# Patient Record
Sex: Female | Born: 1970
Health system: Southern US, Community
[De-identification: ages and names within clinical notes are randomized; demographics above are authoritative.]

## PROBLEM LIST (undated history)

## (undated) DIAGNOSIS — F419 Anxiety disorder, unspecified: Secondary | ICD-10-CM

## (undated) DIAGNOSIS — Z5189 Encounter for other specified aftercare: Secondary | ICD-10-CM

## (undated) DIAGNOSIS — D649 Anemia, unspecified: Secondary | ICD-10-CM

## (undated) DIAGNOSIS — Z789 Other specified health status: Secondary | ICD-10-CM

## (undated) HISTORY — PX: ABDOMINAL HYSTERECTOMY: SHX81

## (undated) HISTORY — PX: DIAGNOSTIC LAPAROSCOPY: SUR761

## (undated) HISTORY — DX: Anxiety disorder, unspecified: F41.9

## (undated) HISTORY — DX: Anemia, unspecified: D64.9

---

## 2000-02-22 ENCOUNTER — Other Ambulatory Visit: Admission: RE | Admit: 2000-02-22 | Discharge: 2000-02-22 | Payer: Self-pay | Admitting: Obstetrics and Gynecology

## 2001-02-13 ENCOUNTER — Inpatient Hospital Stay (HOSPITAL_COMMUNITY): Admission: AD | Admit: 2001-02-13 | Discharge: 2001-02-16 | Payer: Self-pay | Admitting: Obstetrics and Gynecology

## 2001-03-13 ENCOUNTER — Other Ambulatory Visit: Admission: RE | Admit: 2001-03-13 | Discharge: 2001-03-13 | Payer: Self-pay | Admitting: Obstetrics and Gynecology

## 2002-04-15 ENCOUNTER — Other Ambulatory Visit: Admission: RE | Admit: 2002-04-15 | Discharge: 2002-04-15 | Payer: Self-pay | Admitting: Obstetrics and Gynecology

## 2007-02-18 ENCOUNTER — Emergency Department (HOSPITAL_COMMUNITY): Admission: EM | Admit: 2007-02-18 | Discharge: 2007-02-18 | Payer: Self-pay | Admitting: Emergency Medicine

## 2007-05-11 ENCOUNTER — Encounter: Admission: RE | Admit: 2007-05-11 | Discharge: 2007-05-11 | Payer: Self-pay | Admitting: Internal Medicine

## 2008-10-07 ENCOUNTER — Encounter: Admission: RE | Admit: 2008-10-07 | Discharge: 2008-10-07 | Payer: Self-pay | Admitting: Obstetrics and Gynecology

## 2008-12-09 ENCOUNTER — Inpatient Hospital Stay (HOSPITAL_COMMUNITY): Admission: AD | Admit: 2008-12-09 | Discharge: 2008-12-09 | Payer: Self-pay | Admitting: Obstetrics & Gynecology

## 2008-12-12 ENCOUNTER — Ambulatory Visit (HOSPITAL_COMMUNITY): Admission: RE | Admit: 2008-12-12 | Discharge: 2008-12-12 | Payer: Self-pay | Admitting: Obstetrics and Gynecology

## 2009-01-27 ENCOUNTER — Inpatient Hospital Stay (HOSPITAL_COMMUNITY): Admission: AD | Admit: 2009-01-27 | Discharge: 2009-01-27 | Payer: Self-pay | Admitting: Obstetrics & Gynecology

## 2009-08-06 ENCOUNTER — Encounter (INDEPENDENT_AMBULATORY_CARE_PROVIDER_SITE_OTHER): Payer: Self-pay | Admitting: Obstetrics and Gynecology

## 2009-08-06 ENCOUNTER — Inpatient Hospital Stay (HOSPITAL_COMMUNITY): Admission: AD | Admit: 2009-08-06 | Discharge: 2009-08-09 | Payer: Self-pay | Admitting: Obstetrics and Gynecology

## 2010-06-22 ENCOUNTER — Other Ambulatory Visit: Payer: Self-pay | Admitting: Obstetrics and Gynecology

## 2010-06-22 DIAGNOSIS — Z1231 Encounter for screening mammogram for malignant neoplasm of breast: Secondary | ICD-10-CM

## 2010-06-30 ENCOUNTER — Ambulatory Visit
Admission: RE | Admit: 2010-06-30 | Discharge: 2010-06-30 | Disposition: A | Discharge status: Home / Self Care | Payer: 59 | Source: Ambulatory Visit | Attending: Obstetrics and Gynecology | Admitting: Obstetrics and Gynecology

## 2010-06-30 DIAGNOSIS — Z1231 Encounter for screening mammogram for malignant neoplasm of breast: Secondary | ICD-10-CM

## 2010-07-06 LAB — TYPE AND SCREEN: Antibody Screen: NEGATIVE

## 2010-07-06 LAB — CBC
HCT: 29.5 % — ABNORMAL LOW (ref 36.0–46.0)
HCT: 39.3 % (ref 36.0–46.0)
Hemoglobin: 10.4 g/dL — ABNORMAL LOW (ref 12.0–15.0)
MCHC: 35.3 g/dL (ref 30.0–36.0)
RDW: 15.5 % (ref 11.5–15.5)
WBC: 13.6 10*3/uL — ABNORMAL HIGH (ref 4.0–10.5)

## 2010-07-06 LAB — PREPARE RBC (CROSSMATCH)

## 2010-07-24 LAB — ABO/RH: ABO/RH(D): O POS

## 2010-07-24 LAB — CBC
HCT: 40.7 % (ref 36.0–46.0)
Hemoglobin: 12.9 g/dL (ref 12.0–15.0)
MCHC: 33.9 g/dL (ref 30.0–36.0)
MCHC: 34.4 g/dL (ref 30.0–36.0)
MCV: 82.8 fL (ref 78.0–100.0)
RBC: 4.54 MIL/uL (ref 3.87–5.11)

## 2010-07-24 LAB — TYPE AND SCREEN: Antibody Screen: NEGATIVE

## 2010-08-31 NOTE — Op Note (Signed)
Megan Burch, Megan Burch                 ACCOUNT NO.:  [id number]   MEDICAL RECORD NO.:  [id number]          PATIENT TYPE:  AMB   LOCATION:  SDC                           FACILITY:  [facility]   PHYSICIAN:  IT trainer, M.D.DATE OF BIRTH:  1970-11-12   DATE OF PROCEDURE:  12/12/2008  DATE OF DISCHARGE:  12/12/2008                               OPERATIVE REPORT   PREOPERATIVE DIAGNOSES:  Left lower quadrant pain, left adnexal mass,  question left ectopic pregnancy.   PROCEDURE:  Open diagnostic laparoscopy.   POSTOPERATIVE DIAGNOSIS:  Left lower quadrant pain.   ANESTHESIA:  General.   SURGEON:  Maxie Better, MD   ASSISTANT:  Sherry A. Rosalio Macadamia, MD   INDICATIONS:  This is a 40 year old gravida 3, para 2 female with a last  menstrual period at the end of July who with a history of cesarean  section x2 presented with 4 days ago with a complaint of acute left  lower quadrant pain and a positive pregnancy test.  Ultrasound done in  the office has revealed a perivascular 2.6 cm left adnexal mass.  The  location was unclear.  The patient was sent to [facility], where a  quantitative HCG was then performed which was 278.  The patient has  returned 2 days later for a repeat quant which was 505 and a repeat  ultrasound which showed increase in the size of the vascularity to 3.6  cm mass and some increased fluid in the posterior cul-de-sac.  After  lengthy discussion, the patient opted for a surgery to further delineate  this mass.  Surgical risk was reviewed, including possible loss of the  pregnancy and possible loss of the involved tube if it is an ectopic,  possibility of the heterotopic pregnancy was also discussed.  The  patient was transferred to the operating room.   PROCEDURE:  Under adequate general anesthesia, the patient was placed in  the dorsal lithotomy position.  A indwelling Foley catheter was placed  after sterilely prepped and draped in the patient.  I opted not  to place  any instruments in the vagina due to the possibility that this maybe an  early intrauterine pregnancy and attention was then turned to the  abdomen.  A 0.25% Marcaine was injected infraumbilically.  An  infraumbilical incision was then made and after a long a good 1 inch  depth around the fascia was located, opened, and the parietal peritoneum  was then opened sharply.  The pursestring of 0 Vicryl suture was then  placed around this fascia.  A Hasson cannula was then introduced and the  lighted video laparoscope was then placed through that port.  The  initial entry into the pelvis was atraumatic.  There was no evidence of  blood in the pelvis.  Decision was then made to after attempting to use  a long probe through the laparoscope port.  Decision was then made to  place a small port on the left lower quadrant.  A small incision was  made and a 5-mm trocar was introduced under direct visualization with  some bleeding  noted at the site on the anterior abdominal wall.  Using a  probe, the uterus was gently lifted.  Both tubes and ovaries were noted.  There is a clear yellow fluid in the posterior cul-de-sac.  The right  tube and ovary was normal.  The left tube was otherwise normal.  The  ovary on the left showed that it has probably a corpus luteal cyst.  There was a large prominent blood vessels below that almost similar to  what would expect for a pelvic congestion, but no actually defined  abnormality on either side.  The fluid in the cul-de-sac was aspirated.  There was an omental adhesion focally anterior abdominal near the  umbilical incision, but that was left alone.  After assuring that there  was nothing abnormal other than the vessels that were present, decision  was then made to complete the procedure.  The left lower quadrant site  was removed.  Kleppinger was then used to cauterize the base of that  left lower quadrant site.  Good hemostasis noted.  The trocar was   removed.  The abdomen was deflated. A finger was placed in the umbilical  site and the fascial stitch was pulled up around it  in order to prevent  bringing up of any bowels in the closure.  The fascia was closed  subcuticular 4-0 Vicryl sutures in place for both umbilical and the left  lower quadrant incision and Dermabond placed.  Specimen was none.  Estimated blood loss was minimal.  Complication was none.  The patient  tolerated the procedure well and was transferred to the recovery room in  stable condition to have a repeat quantitative HCG will be done in the  office on August 30.      Maxie Better, M.D.  Electronically Signed     [place]/MEDQ  D:  12/12/2008  T:  12/13/2008  Job:  [id number]

## 2010-09-03 NOTE — Discharge Summary (Signed)
[facility]  Patient:    Megan Burch, Megan Burch Visit Number: [id number] MRN: [id number]          Service Type: OBS Location: 910A 9109 [id number] Attending Physician:  Maxie Better Dictated by:   Sheria Lang. Cherly Hensen, M.D. Admit Date:  02/13/2001 Discharge Date: 02/16/2001                             Discharge Summary  ADMISSION DIAGNOSIS:  Term gestation, previous cesarean section.  DISCHARGE DIAGNOSES: 1. Term gestation, delivered. 2. Previous cesarean section. 3. Postoperative anemia.  PROCEDURE:  Repeat cesarean section.  HISTORY OF PRESENT ILLNESS:  The patient is a 40 year old, gravida 2, para 1-0-0-1, with previous cesarean section admitted for repeat cesarean section.  HOSPITAL COURSE:  The patient was admitted to [facility].  She underwent a repeat low transverse cesarean section under spinal anesthesia.  This resulted in the delivery of a live female, Apgars of 8 and 9.  Subsequent weight of the baby was 6 pounds 2 ounces.  She had an uncomplicated postoperative course.  Blood type is O+.  Rubella is equivocal.  Therefore, the patient received a repeat rubella vaccination.  Her CBC on postoperative day #1, showed a hemoglobin of 10.8, hematocrit of 30.4, white count of 11, platelet count of 206,000.  With the patient tolerating a regular diet, having had flatus, and remaining afebrile, she was discharged on postoperative day #3.  DISPOSITION:  Home.  CONDITION ON DISCHARGE:  Stable.  DISCHARGE MEDICATIONS: 1. Prenatal vitamins one p.o. q.d. 2. Tylox one or two tablets q.3-4h. p.r.n. pain. 3. Motrin 600 mg p.o. q.6h. p.r.n. pain.  FOLLOWUP:  [facility] in 4 to 6 weeks.  DISCHARGE INSTRUCTIONS: 1. Call for temperature greater than or equal to 100.4. 2. Nothing per vagina for 4 to 6 weeks. 3. No heavy lifting or driving for 2 weeks. 4. Call if increased incisional pain, redness, or drainage from the incision    site,  severe abdominal pain, nausea or vomiting, soaking a regular pad    every hour or more frequently.Dictated by:   Sheria Lang. Cherly Hensen, M.D.  Attending Physician:  Maxie Better DD:  03/13/01 TD:  03/13/01 Job: [id number]

## 2010-09-03 NOTE — Op Note (Signed)
[facility]  Patient:    Megan Burch, Megan Burch Visit Number: [id number] MRN: [id number]          Service Type: OBS Location: MATC Attending Physician:  Maxie Better Dictated by:   Sheria Lang. Cherly Hensen, M.D. Proc. Date: 02/13/01                             Operative Report  PREOPERATIVE DIAGNOSES:       1. Term gestation.                               2. Previous cesarean section.  POSTOPERATIVE DIAGNOSES:      1. Term gestation.                               2. Previous cesarean section.  PROCEDURE:                    Repeat cesarean section, Kerr hysterotomy.  SURGEON:                      Sheronette A. Cherly Hensen, M.D.  ASSISTANT:                    Pershing Cox, M.D.  ANESTHESIA:                   Spinal.  INDICATIONS:                  This is a 40 year old gravida 2, para 1-0-0-1 female with a previous cesarean section who is now at term and who desires a repeat cesarean section.  The risks and benefits of the procedure have been explained to the patient and her husband.  Consent was signed.  The patient was transferred to the operating room.  DESCRIPTION OF PROCEDURE:     Under adequate spinal anesthesia, the patient was placed in the supine position with a left lateral tilt.  She was sterilely prepped and draped in the usual fashion.  An indwelling Foley catheter was sterilely placed.  A total of 10 cc of 0.25% Marcaine was injected along the previous Pfannenstiel skin incision.  A Pfannenstiel incision was made through the previous scar and carried down to the rectus fascia using a scalpel as well as Bovie cautery.  The rectus fascia was incised in the midline and extended bilaterally.  The rectus fascia was then bluntly and with the Mayo scissors dissected off the rectus muscle in a superior and inferior fashion. In dissecting the upper portion of the rectus fascia from the rectus muscle, the parietal peritoneum was subsequently  entered.  The parietal peritoneum was then extended inferiorly as well as superiorly.  The vesicouterine peritoneum was developed.  The bladder was slightly adherent to the lower uterine segment.  However, with blunt dissection, the bladder was displaced from the lower uterine segment and retracted with a Doyen retractor.  A curvilinear transverse lower uterine incision was then made and extended bluntly. Amniotic fluid bag was noted, which was then ruptured with clear fluid. Subsequent delivery of a live female infant from the left occiput transverse position was accomplished.  The baby was delivered and bulb suctioned on the abdomen.  The cord was then clamped and cut.  The baby was transferred to the awaiting pediatricians,  who subsequently assigned Apgars of 8 and 9 at one and five minutes.  The weight of the baby was 6 lb 2 oz.  Cord bloods were obtained.  The placenta was spontaneous and intact.  The uterine cavity was cleaned of debris.  No intracavitary abnormalities were noted.  The uterine incision was closed in two layers.  The first layer was a running lock stitch of 0 Monocryl.  The second layer was and imbricating using 0 Monocryl suture. Small bleeding along the peritoneal edges was cauterized.  The abdomen was copiously irrigated and suctioned of fluid and debris.  Normal tubes and ovaries were noted bilaterally.  With good hemostasis noted along the incision line, the parietal and the vesicouterine peritoneum were not closed.  The surface of the rectus fascia was inspected.  The rectus fascia was closed with 0 Vicryl x 2.  The subcutaneous area was irrigated.  Small bleeders were cauterized.  The skin was approximated using Ethicon staples.  Specimen labeled placenta was not sent to pathology.  Estimated blood loss was 650 cc. Urine output was 50 cc of clear yellow urine.  Intraoperative fluid was 3100 cc of Ringers lactate.  Sponge and instrument counts x 2 were  correct. There were no complications.  The patient tolerated the procedure well and was transferred to the recovery room in stable condition. Dictated by:   Sheria Lang. Cherly Hensen, M.D. Attending Physician:  Maxie Better DD:  02/13/01 TD:  02/14/01 Job: [id number]

## 2011-06-16 ENCOUNTER — Other Ambulatory Visit: Payer: Self-pay | Admitting: Obstetrics and Gynecology

## 2011-06-16 DIAGNOSIS — Z1231 Encounter for screening mammogram for malignant neoplasm of breast: Secondary | ICD-10-CM

## 2011-07-05 ENCOUNTER — Ambulatory Visit
Admission: RE | Admit: 2011-07-05 | Discharge: 2011-07-05 | Disposition: A | Discharge status: Home / Self Care | Payer: 59 | Source: Ambulatory Visit | Attending: Obstetrics and Gynecology | Admitting: Obstetrics and Gynecology

## 2011-07-05 DIAGNOSIS — Z1231 Encounter for screening mammogram for malignant neoplasm of breast: Secondary | ICD-10-CM

## 2011-07-13 ENCOUNTER — Other Ambulatory Visit: Payer: Self-pay | Admitting: Obstetrics and Gynecology

## 2011-07-13 ENCOUNTER — Observation Stay (HOSPITAL_COMMUNITY)
Admission: AD | Admit: 2011-07-13 | Discharge: 2011-07-13 | Disposition: A | Discharge status: Home / Self Care | Payer: 59 | Source: Ambulatory Visit | Attending: Obstetrics and Gynecology | Admitting: Obstetrics and Gynecology

## 2011-07-13 ENCOUNTER — Encounter (HOSPITAL_COMMUNITY): Payer: Self-pay | Admitting: *Deleted

## 2011-07-13 DIAGNOSIS — N920 Excessive and frequent menstruation with regular cycle: Secondary | ICD-10-CM | POA: Insufficient documentation

## 2011-07-13 DIAGNOSIS — Z5189 Encounter for other specified aftercare: Secondary | ICD-10-CM

## 2011-07-13 DIAGNOSIS — D500 Iron deficiency anemia secondary to blood loss (chronic): Principal | ICD-10-CM | POA: Insufficient documentation

## 2011-07-13 DIAGNOSIS — D649 Anemia, unspecified: Secondary | ICD-10-CM

## 2011-07-13 DIAGNOSIS — IMO0001 Reserved for inherently not codable concepts without codable children: Secondary | ICD-10-CM

## 2011-07-13 HISTORY — DX: Reserved for inherently not codable concepts without codable children: IMO0001

## 2011-07-13 HISTORY — DX: Encounter for other specified aftercare: Z51.89

## 2011-07-13 HISTORY — DX: Other specified health status: Z78.9

## 2011-07-13 LAB — PREPARE RBC (CROSSMATCH)

## 2011-07-13 MED ORDER — MEDROXYPROGESTERONE ACETATE 400 MG/ML IM SUSP
300.0000 mg | Freq: Once | INTRAMUSCULAR | Status: AC
  Administered 2011-07-13: 300 mg via INTRAMUSCULAR
  Filled 2011-07-13: qty 0.75

## 2011-07-13 MED ORDER — ACETAMINOPHEN 325 MG PO TABS
650.0000 mg | ORAL_TABLET | Freq: Once | ORAL | Status: AC
  Administered 2011-07-13: 650 mg via ORAL
  Filled 2011-07-13: qty 2

## 2011-07-13 MED ORDER — DIPHENHYDRAMINE HCL 25 MG PO CAPS
25.0000 mg | ORAL_CAPSULE | Freq: Once | ORAL | Status: AC
  Administered 2011-07-13: 25 mg via ORAL
  Filled 2011-07-13: qty 1

## 2011-07-13 MED ORDER — IBUPROFEN 800 MG PO TABS
800.0000 mg | ORAL_TABLET | Freq: Once | ORAL | Status: AC
  Administered 2011-07-13: 800 mg via ORAL
  Filled 2011-07-13: qty 1

## 2011-07-13 NOTE — Progress Notes (Signed)
Pt to be discharged after receiving  blood. Pt complaining shortness of breath. Encouraged pt to ambulate when blood finished transfusing.Checked pt vitals signs posttransfusion. Notified Dr. Cherly Hensen of pt latest vital and complaints of shortness of breath and the feeling that there is some swelling in her feet. No swelling noted in LLE extremities by Primary Nurse.  Dr. Cherly Hensen said pt ok to go home.

## 2011-07-13 NOTE — Progress Notes (Signed)
Discharge instructions given to and reviewed with patient.  Pt verbalized understanding of discharge instructions.2nd copy of discharge instructions signed and place in patient shadow chart. Pt escorted out by Boykin Reaper, Nurse tech, via wheelchair.

## 2011-07-13 NOTE — Discharge Instructions (Signed)
Call if soaking a pad  q hours or more freq

## 2011-07-13 NOTE — H&P (Signed)
Megan Burch is an 41 y.o. female. P3 MBF sent for blood transfusion 2nd to symptomatic anemia due to prolonged heavy cycles > 1 yr. Pt has currently been bleeding for 3 wk. (+) clots. C/o fatigue. sono done in office showed thickened endometrium. hgb 6.6 hct 17  Pertinent Gynecological History: Menses: flow is excessive with use of multiple pads or tampons on heaviest days Bleeding: heavy Contraception: tubal ligation DES exposure: denies Blood transfusions: current Sexually transmitted diseases: no past history Previous GYN Procedures: c/s  Last mammogram: normal Date:2010 Last pap: normal Date: 2010 OB History: G3, P3   Menstrual History: Menarche age:  No LMP recorded. 3 wk ago    Past Medical History  Diagnosis Date  . No pertinent past medical history   hgb C trait  Past Surgical History  Procedure Date  . Cesarean section   TL  No family history on file.noncontributory  Social History:  reports that she has never smoked. She has never used smokeless tobacco. She reports that she does not drink alcohol or use illicit drugs.  Allergies: No Known Allergies  Prescriptions prior to admission  Medication Sig Dispense Refill  . Cyanocobalamin (VITAMIN B12 PO) Take 1 tablet by mouth every morning.      . fish oil-omega-3 fatty acids 1000 MG capsule Take 1 g by mouth every morning.      Marland Kitchen ibuprofen (ADVIL,MOTRIN) 200 MG tablet Take 600 mg by mouth every 6 (six) hours as needed.        ROS: normal menses, no abnormal bleeding, pelvic pain or discharge, no breast pain or new or enlarging lumps on self exam, no discharge or pelvic pain  Blood pressure 131/74, pulse 104, temperature 99 F (37.2 C), temperature source Oral, resp. rate 18, SpO2 100.00%.  PHYSICAL EXAM: General: alert, cooperative and no distress Resp: clear to auscultation bilaterally Cardio: regular rate and rhythm, S1, S2 normal, no murmur, click, rub or gallop GI: soft, non-tender; bowel sounds normal;  no masses,  no organomegaly Extremities: extremities normal, atraumatic, no cyanosis or edema Vaginal Bleeding: moderate  Results for orders placed during the hospital encounter of 07/13/11 (from the past 24 hour(s))  PREPARE RBC (CROSSMATCH)     Status: Normal   Collection Time   07/13/11 11:05 AM      Component Value Range   Order Confirmation ORDER PROCESSED BY BLOOD BANK    TYPE AND SCREEN     Status: Normal (Preliminary result)   Collection Time   07/13/11 11:05 AM      Component Value Range   ABO/RH(D) O POS     Antibody Screen NEG     Sample Expiration 07/16/2011     Unit Number [id number]     Blood Component Type RBC CPDA1, LR     Unit division 00     Status of Unit ISSUED     Transfusion Status OK TO TRANSFUSE     Crossmatch Result Compatible     Unit Number [id number]     Blood Component Type RED CELLS,LR     Unit division 00     Status of Unit ISSUED     Transfusion Status OK TO TRANSFUSE     Crossmatch Result Compatible     Unit Number [id number]     Blood Component Type RED CELLS,LR     Unit division 00     Status of Unit ISSUED     Transfusion Status OK TO TRANSFUSE     Crossmatch Result  Compatible      No results found.  Assessment/Plan: Menometrorrhagia w/ resultant severe iron deficiency anemia superimposed on hgb c trait P) 3 units PRBC.  Oral iron . DMPA 300mg  IM x 1. Disc options: endom ablation, declines OCP, hysterectomy( desired)  Megan Burch A 07/13/2011, 6:21 PM

## 2011-07-14 LAB — TYPE AND SCREEN
Antibody Screen: NEGATIVE
Unit division: 0

## 2011-07-26 ENCOUNTER — Other Ambulatory Visit: Payer: Self-pay | Admitting: Obstetrics and Gynecology

## 2011-07-29 ENCOUNTER — Encounter (HOSPITAL_COMMUNITY): Payer: Self-pay | Admitting: Pharmacist

## 2011-08-10 ENCOUNTER — Encounter (HOSPITAL_COMMUNITY): Payer: Self-pay

## 2011-08-10 ENCOUNTER — Encounter (HOSPITAL_COMMUNITY)
Admission: RE | Admit: 2011-08-10 | Discharge: 2011-08-10 | Disposition: A | Discharge status: Home / Self Care | Payer: 59 | Source: Ambulatory Visit | Attending: Obstetrics and Gynecology | Admitting: Obstetrics and Gynecology

## 2011-08-10 HISTORY — DX: Encounter for other specified aftercare: Z51.89

## 2011-08-10 LAB — CBC
HCT: 36.1 % (ref 36.0–46.0)
Hemoglobin: 12.3 g/dL (ref 12.0–15.0)
MCV: 76 fL — ABNORMAL LOW (ref 78.0–100.0)
Platelets: 412 10*3/uL — ABNORMAL HIGH (ref 150–400)
RBC: 4.75 MIL/uL (ref 3.87–5.11)
WBC: 9.3 10*3/uL (ref 4.0–10.5)

## 2011-08-10 LAB — BASIC METABOLIC PANEL
CO2: 26 mEq/L (ref 19–32)
Chloride: 103 mEq/L (ref 96–112)
Glucose, Bld: 82 mg/dL (ref 70–99)
Sodium: 138 mEq/L (ref 135–145)

## 2011-08-10 NOTE — Patient Instructions (Addendum)
20 Megan Burch  08/10/2011   Your procedure is scheduled on:  4/29  Enter through the Main Entrance of [facility] at 1130 AM.  Pick up the phone at the desk and dial [phone].   Call this number if you have problems the morning of surgery: [phone]   Remember:   Do not eat food:After Midnight.  Do not drink clear liquids: after 9AM  Take these medicines the morning of surgery with A SIP OF WATER: NA   Do not wear jewelry, make-up or nail polish.  Do not wear lotions, powders, or perfumes. You may wear deodorant.  Do not shave 48 hours prior to surgery.  Do not bring valuables to the hospital.  Contacts, dentures or bridgework may not be worn into surgery.  Leave suitcase in the car. After surgery it may be brought to your room.  For patients admitted to the hospital, checkout time is 11:00 AM the day of discharge.   Patients discharged the day of surgery will not be allowed to drive home.  Name and phone number of your driver: NA  Special Instructions: CHG Shower Use Special Wash: 1/2 bottle night before surgery and 1/2 bottle morning of surgery.   Please read over the following fact sheets that you were given: MRSA Information

## 2011-08-14 MED ORDER — CEFAZOLIN SODIUM-DEXTROSE 2-3 GM-% IV SOLR
2.0000 g | INTRAVENOUS | Status: AC
  Administered 2011-08-15: 2 g via INTRAVENOUS
  Filled 2011-08-14: qty 50

## 2011-08-15 ENCOUNTER — Encounter (HOSPITAL_COMMUNITY): Payer: Self-pay | Admitting: Anesthesiology

## 2011-08-15 ENCOUNTER — Encounter (HOSPITAL_COMMUNITY)
Admission: RE | Disposition: A | Discharge status: Home / Self Care | Payer: Self-pay | Source: Ambulatory Visit | Attending: Obstetrics and Gynecology

## 2011-08-15 ENCOUNTER — Encounter (HOSPITAL_COMMUNITY): Payer: Self-pay | Admitting: *Deleted

## 2011-08-15 ENCOUNTER — Ambulatory Visit (HOSPITAL_COMMUNITY)
Admission: RE | Admit: 2011-08-15 | Discharge: 2011-08-16 | Disposition: A | Discharge status: Home / Self Care | Payer: 59 | Source: Ambulatory Visit | Attending: Obstetrics and Gynecology | Admitting: Obstetrics and Gynecology

## 2011-08-15 ENCOUNTER — Ambulatory Visit (HOSPITAL_COMMUNITY): Payer: 59 | Admitting: Anesthesiology

## 2011-08-15 DIAGNOSIS — N920 Excessive and frequent menstruation with regular cycle: Secondary | ICD-10-CM | POA: Insufficient documentation

## 2011-08-15 DIAGNOSIS — D509 Iron deficiency anemia, unspecified: Secondary | ICD-10-CM | POA: Insufficient documentation

## 2011-08-15 DIAGNOSIS — Z01818 Encounter for other preprocedural examination: Secondary | ICD-10-CM | POA: Insufficient documentation

## 2011-08-15 DIAGNOSIS — Z90710 Acquired absence of both cervix and uterus: Secondary | ICD-10-CM

## 2011-08-15 DIAGNOSIS — Z01812 Encounter for preprocedural laboratory examination: Secondary | ICD-10-CM | POA: Insufficient documentation

## 2011-08-15 SURGERY — ROBOTIC ASSISTED TOTAL HYSTERECTOMY
Anesthesia: General | Site: Abdomen | Wound class: Clean Contaminated

## 2011-08-15 MED ORDER — SODIUM CHLORIDE 0.9 % IJ SOLN: 9.0000 mL | INTRAMUSCULAR | Status: DC | PRN

## 2011-08-15 MED ORDER — KETOROLAC TROMETHAMINE 30 MG/ML IJ SOLN
30.0000 mg | Freq: Four times a day (QID) | INTRAMUSCULAR | Status: DC
  Administered 2011-08-15 – 2011-08-16 (×3): 30 mg via INTRAVENOUS
  Filled 2011-08-15 (×2): qty 1

## 2011-08-15 MED ORDER — HYDROMORPHONE HCL PF 1 MG/ML IJ SOLN
0.2500 mg | INTRAMUSCULAR | Status: DC | PRN
  Administered 2011-08-15 (×2): 0.5 mg via INTRAVENOUS

## 2011-08-15 MED ORDER — POLYSACCHARIDE IRON COMPLEX 150 MG PO CAPS
150.0000 mg | ORAL_CAPSULE | Freq: Every day | ORAL | Status: DC
  Administered 2011-08-16: 150 mg via ORAL
  Filled 2011-08-15 (×3): qty 1

## 2011-08-15 MED ORDER — CEFAZOLIN SODIUM 1-5 GM-% IV SOLN
1.0000 g | Freq: Three times a day (TID) | INTRAVENOUS | Status: AC
  Administered 2011-08-15 – 2011-08-16 (×2): 1 g via INTRAVENOUS
  Filled 2011-08-15 (×2): qty 50

## 2011-08-15 MED ORDER — FENTANYL CITRATE 0.05 MG/ML IJ SOLN
INTRAMUSCULAR | Status: DC | PRN
  Administered 2011-08-15: 150 ug via INTRAVENOUS
  Administered 2011-08-15: 100 ug via INTRAVENOUS
  Administered 2011-08-15: 200 ug via INTRAVENOUS
  Administered 2011-08-15: 50 ug via INTRAVENOUS

## 2011-08-15 MED ORDER — HYDROMORPHONE HCL PF 1 MG/ML IJ SOLN
INTRAMUSCULAR | Status: AC
  Filled 2011-08-15: qty 1

## 2011-08-15 MED ORDER — PROPOFOL 10 MG/ML IV EMUL
INTRAVENOUS | Status: AC
  Filled 2011-08-15: qty 20

## 2011-08-15 MED ORDER — KETOROLAC TROMETHAMINE 30 MG/ML IJ SOLN: 30.0000 mg | Freq: Four times a day (QID) | INTRAMUSCULAR | Status: DC

## 2011-08-15 MED ORDER — PANTOPRAZOLE SODIUM 40 MG PO TBEC
40.0000 mg | DELAYED_RELEASE_TABLET | Freq: Every day | ORAL | Status: DC
  Administered 2011-08-16: 40 mg via ORAL
  Filled 2011-08-15 (×3): qty 1

## 2011-08-15 MED ORDER — STERILE WATER FOR IRRIGATION IR SOLN: Status: DC | PRN

## 2011-08-15 MED ORDER — OXYCODONE-ACETAMINOPHEN 5-325 MG PO TABS
1.0000 | ORAL_TABLET | ORAL | Status: DC | PRN
  Administered 2011-08-16: 2 via ORAL
  Filled 2011-08-15: qty 2

## 2011-08-15 MED ORDER — LACTATED RINGERS IV SOLN
INTRAVENOUS | Status: DC
  Administered 2011-08-15 (×2): via INTRAVENOUS

## 2011-08-15 MED ORDER — LIDOCAINE HCL (CARDIAC) 20 MG/ML IV SOLN
INTRAVENOUS | Status: DC | PRN
  Administered 2011-08-15: 20 mg via INTRAVENOUS
  Administered 2011-08-15: 50 mg via INTRAVENOUS

## 2011-08-15 MED ORDER — DEXAMETHASONE SODIUM PHOSPHATE 10 MG/ML IJ SOLN
INTRAMUSCULAR | Status: AC
  Filled 2011-08-15: qty 1

## 2011-08-15 MED ORDER — LIDOCAINE HCL (CARDIAC) 20 MG/ML IV SOLN
INTRAVENOUS | Status: AC
  Filled 2011-08-15: qty 5

## 2011-08-15 MED ORDER — ONDANSETRON HCL 4 MG/2ML IJ SOLN: 4.0000 mg | Freq: Four times a day (QID) | INTRAMUSCULAR | Status: DC | PRN

## 2011-08-15 MED ORDER — HYDROMORPHONE 0.3 MG/ML IV SOLN
INTRAVENOUS | Status: DC
  Administered 2011-08-15: 1.99 mg via INTRAVENOUS
  Administered 2011-08-15: 18:00:00 via INTRAVENOUS
  Administered 2011-08-16: 0.4 mg via INTRAVENOUS
  Administered 2011-08-16: 0.719 mg via INTRAVENOUS
  Filled 2011-08-15: qty 25

## 2011-08-15 MED ORDER — ONDANSETRON HCL 4 MG/2ML IJ SOLN
INTRAMUSCULAR | Status: AC
  Filled 2011-08-15: qty 2

## 2011-08-15 MED ORDER — STERILE WATER FOR IRRIGATION IR SOLN
Status: DC | PRN
  Administered 2011-08-15: 14:00:00 via INTRAVESICAL

## 2011-08-15 MED ORDER — DEXTROSE IN LACTATED RINGERS 5 % IV SOLN
INTRAVENOUS | Status: DC
  Administered 2011-08-15 – 2011-08-16 (×2): via INTRAVENOUS

## 2011-08-15 MED ORDER — KETOROLAC TROMETHAMINE 30 MG/ML IJ SOLN
INTRAMUSCULAR | Status: AC
  Administered 2011-08-15: 30 mg via INTRAVENOUS
  Filled 2011-08-15: qty 1

## 2011-08-15 MED ORDER — ROCURONIUM BROMIDE 50 MG/5ML IV SOLN
INTRAVENOUS | Status: AC
  Filled 2011-08-15: qty 1

## 2011-08-15 MED ORDER — PROPOFOL 10 MG/ML IV EMUL
INTRAVENOUS | Status: DC | PRN
  Administered 2011-08-15: 200 mg via INTRAVENOUS

## 2011-08-15 MED ORDER — DIPHENHYDRAMINE HCL 12.5 MG/5ML PO ELIX: 12.5000 mg | ORAL_SOLUTION | Freq: Four times a day (QID) | ORAL | Status: DC | PRN

## 2011-08-15 MED ORDER — GLYCOPYRROLATE 0.2 MG/ML IJ SOLN
INTRAMUSCULAR | Status: AC
  Filled 2011-08-15: qty 1

## 2011-08-15 MED ORDER — NEOSTIGMINE METHYLSULFATE 1 MG/ML IJ SOLN
INTRAMUSCULAR | Status: DC | PRN
  Administered 2011-08-15: 3 mg via INTRAVENOUS

## 2011-08-15 MED ORDER — NEOSTIGMINE METHYLSULFATE 1 MG/ML IJ SOLN
INTRAMUSCULAR | Status: AC
  Filled 2011-08-15: qty 10

## 2011-08-15 MED ORDER — NALOXONE HCL 0.4 MG/ML IJ SOLN: 0.4000 mg | INTRAMUSCULAR | Status: DC | PRN

## 2011-08-15 MED ORDER — ACETAMINOPHEN 10 MG/ML IV SOLN
1000.0000 mg | Freq: Once | INTRAVENOUS | Status: AC
  Administered 2011-08-15: 1000 mg via INTRAVENOUS
  Filled 2011-08-15: qty 100

## 2011-08-15 MED ORDER — IBUPROFEN 800 MG PO TABS
800.0000 mg | ORAL_TABLET | Freq: Three times a day (TID) | ORAL | Status: DC | PRN
  Administered 2011-08-16: 800 mg via ORAL
  Filled 2011-08-15: qty 1

## 2011-08-15 MED ORDER — MENTHOL 3 MG MT LOZG: 1.0000 | LOZENGE | OROMUCOSAL | Status: DC | PRN

## 2011-08-15 MED ORDER — FENTANYL CITRATE 0.05 MG/ML IJ SOLN
INTRAMUSCULAR | Status: AC
  Filled 2011-08-15: qty 5

## 2011-08-15 MED ORDER — DIPHENHYDRAMINE HCL 50 MG/ML IJ SOLN: 12.5000 mg | Freq: Four times a day (QID) | INTRAMUSCULAR | Status: DC | PRN

## 2011-08-15 MED ORDER — ONDANSETRON HCL 4 MG PO TABS: 4.0000 mg | ORAL_TABLET | Freq: Four times a day (QID) | ORAL | Status: DC | PRN

## 2011-08-15 MED ORDER — DEXAMETHASONE SODIUM PHOSPHATE 4 MG/ML IJ SOLN
INTRAMUSCULAR | Status: DC | PRN
  Administered 2011-08-15: 10 mg via INTRAVENOUS

## 2011-08-15 MED ORDER — LACTATED RINGERS IR SOLN
Status: DC | PRN
  Administered 2011-08-15: 3000 mL

## 2011-08-15 MED ORDER — ZOLPIDEM TARTRATE 5 MG PO TABS: 5.0000 mg | ORAL_TABLET | Freq: Every evening | ORAL | Status: DC | PRN

## 2011-08-15 MED ORDER — MIDAZOLAM HCL 2 MG/2ML IJ SOLN
INTRAMUSCULAR | Status: AC
  Filled 2011-08-15: qty 2

## 2011-08-15 MED ORDER — GLYCOPYRROLATE 0.2 MG/ML IJ SOLN
INTRAMUSCULAR | Status: DC | PRN
  Administered 2011-08-15: 0.3 mg via INTRAVENOUS

## 2011-08-15 MED ORDER — HYDROMORPHONE HCL PF 1 MG/ML IJ SOLN: 0.2000 mg | INTRAMUSCULAR | Status: DC | PRN

## 2011-08-15 MED ORDER — MIDAZOLAM HCL 5 MG/5ML IJ SOLN
INTRAMUSCULAR | Status: DC | PRN
  Administered 2011-08-15: 2 mg via INTRAVENOUS

## 2011-08-15 MED ORDER — HYDROMORPHONE HCL PF 1 MG/ML IJ SOLN
INTRAMUSCULAR | Status: DC | PRN
  Administered 2011-08-15: 1 mg via INTRAVENOUS

## 2011-08-15 MED ORDER — BUPIVACAINE HCL (PF) 0.25 % IJ SOLN
INTRAMUSCULAR | Status: AC
  Filled 2011-08-15: qty 30

## 2011-08-15 MED ORDER — HYDROMORPHONE HCL PF 1 MG/ML IJ SOLN
INTRAMUSCULAR | Status: AC
  Administered 2011-08-15: 0.5 mg via INTRAVENOUS
  Filled 2011-08-15: qty 1

## 2011-08-15 MED ORDER — ROCURONIUM BROMIDE 100 MG/10ML IV SOLN
INTRAVENOUS | Status: DC | PRN
  Administered 2011-08-15: 20 mg via INTRAVENOUS
  Administered 2011-08-15: 50 mg via INTRAVENOUS

## 2011-08-15 MED ORDER — ONDANSETRON HCL 4 MG/2ML IJ SOLN
INTRAMUSCULAR | Status: DC | PRN
  Administered 2011-08-15: 4 mg via INTRAVENOUS

## 2011-08-15 MED ORDER — BUPIVACAINE HCL (PF) 0.25 % IJ SOLN
INTRAMUSCULAR | Status: DC | PRN
  Administered 2011-08-15: 10 mL

## 2011-08-15 MED ORDER — METHYLENE BLUE 1 % INJ SOLN
INTRAMUSCULAR | Status: AC
  Filled 2011-08-15: qty 1

## 2011-08-15 SURGICAL SUPPLY — 64 items
BAG URINE DRAINAGE (UROLOGICAL SUPPLIES) ×3 IMPLANT
BARRIER ADHS 3X4 INTERCEED (GAUZE/BANDAGES/DRESSINGS) ×3 IMPLANT
CABLE HIGH FREQUENCY MONO STRZ (ELECTRODE) ×3 IMPLANT
CATH FOLEY 3WAY  5CC 16FR (CATHETERS) ×1
CATH FOLEY 3WAY 5CC 16FR (CATHETERS) ×2 IMPLANT
CHLORAPREP W/TINT 26ML (MISCELLANEOUS) ×3 IMPLANT
CLOTH BEACON ORANGE TIMEOUT ST (SAFETY) ×3 IMPLANT
CONT PATH 16OZ SNAP LID 3702 (MISCELLANEOUS) ×6 IMPLANT
COVER MAYO STAND STRL (DRAPES) ×3 IMPLANT
COVER TABLE BACK 60X90 (DRAPES) ×6 IMPLANT
COVER TIP SHEARS 8 DVNC (MISCELLANEOUS) ×2 IMPLANT
COVER TIP SHEARS 8MM DA VINCI (MISCELLANEOUS) ×1
DECANTER SPIKE VIAL GLASS SM (MISCELLANEOUS) ×3 IMPLANT
DERMABOND ADVANCED (GAUZE/BANDAGES/DRESSINGS) ×1
DERMABOND ADVANCED .7 DNX12 (GAUZE/BANDAGES/DRESSINGS) ×2 IMPLANT
DRAPE HUG U DISPOSABLE (DRAPE) ×3 IMPLANT
DRAPE LG THREE QUARTER DISP (DRAPES) ×6 IMPLANT
DRAPE MONITOR DA VINCI (DRAPE) IMPLANT
DRAPE WARM FLUID 44X44 (DRAPE) ×3 IMPLANT
ELECT REM PT RETURN 9FT ADLT (ELECTROSURGICAL) ×3
ELECTRODE REM PT RTRN 9FT ADLT (ELECTROSURGICAL) ×2 IMPLANT
EVACUATOR SMOKE 8.L (FILTER) ×3 IMPLANT
GAUZE VASELINE 3X9 (GAUZE/BANDAGES/DRESSINGS) IMPLANT
GLOVE BIO SURGEON STRL SZ 6.5 (GLOVE) ×12 IMPLANT
GLOVE BIOGEL PI IND STRL 7.0 (GLOVE) ×6 IMPLANT
GLOVE BIOGEL PI INDICATOR 7.0 (GLOVE) ×3
GOWN STRL REIN XL XLG (GOWN DISPOSABLE) ×18 IMPLANT
KIT ACCESSORY DA VINCI DISP (KITS) ×1
KIT ACCESSORY DVNC DISP (KITS) ×2 IMPLANT
KIT DISP ACCESSORY 4 ARM (KITS) IMPLANT
NEEDLE INSUFFLATION 14GA 120MM (NEEDLE) ×3 IMPLANT
OCCLUDER COLPOPNEUMO (BALLOONS) ×3 IMPLANT
PACK LAVH (CUSTOM PROCEDURE TRAY) ×3 IMPLANT
PAD PREP 24X48 CUFFED NSTRL (MISCELLANEOUS) ×6 IMPLANT
PENCIL BUTTON HOLSTER BLD 10FT (ELECTRODE) ×3 IMPLANT
PLUG CATH AND CAP STER (CATHETERS) ×3 IMPLANT
PROTECTOR NERVE ULNAR (MISCELLANEOUS) ×6 IMPLANT
SCISSORS LAP 5X35 DISP (ENDOMECHANICALS) IMPLANT
SET CYSTO W/LG BORE CLAMP LF (SET/KITS/TRAYS/PACK) IMPLANT
SET IRRIG TUBING LAPAROSCOPIC (IRRIGATION / IRRIGATOR) ×3 IMPLANT
SOLUTION ELECTROLUBE (MISCELLANEOUS) ×3 IMPLANT
SPONGE LAP 18X18 X RAY DECT (DISPOSABLE) IMPLANT
SUT VIC AB 0 CT1 27 (SUTURE) ×8
SUT VIC AB 0 CT1 27XBRD ANBCTR (SUTURE) ×4 IMPLANT
SUT VIC AB 0 CT1 27XBRD ANTBC (SUTURE) ×12 IMPLANT
SUT VIC AB 4-0 PS2 18 (SUTURE) ×3 IMPLANT
SUT VICRYL 0 UR6 27IN ABS (SUTURE) ×6 IMPLANT
SUT VICRYL 4-0 PS2 18IN ABS (SUTURE) ×6 IMPLANT
SYR 50ML LL SCALE MARK (SYRINGE) ×3 IMPLANT
SYSTEM CONVERTIBLE TROCAR (TROCAR) IMPLANT
TIP UTERINE 5.1X6CM LAV DISP (MISCELLANEOUS) IMPLANT
TIP UTERINE 6.7X10CM GRN DISP (MISCELLANEOUS) IMPLANT
TIP UTERINE 6.7X6CM WHT DISP (MISCELLANEOUS) IMPLANT
TIP UTERINE 6.7X8CM BLUE DISP (MISCELLANEOUS) ×3 IMPLANT
TOWEL OR 17X24 6PK STRL BLUE (TOWEL DISPOSABLE) ×9 IMPLANT
TROCAR 12M 150ML BLUNT (TROCAR) ×3 IMPLANT
TROCAR DISP BLADELESS 8 DVNC (TROCAR) ×2 IMPLANT
TROCAR DISP BLADELESS 8MM (TROCAR) ×1
TROCAR XCEL 12X100 BLDLESS (ENDOMECHANICALS) ×3 IMPLANT
TROCAR Z-THREAD 12X150 (TROCAR) ×3 IMPLANT
TROCAR Z-THREAD BLADED 12X100M (TROCAR) IMPLANT
TUBING FILTER THERMOFLATOR (ELECTROSURGICAL) ×3 IMPLANT
WARMER LAPAROSCOPE (MISCELLANEOUS) ×3 IMPLANT
WATER STERILE IRR 1000ML POUR (IV SOLUTION) ×9 IMPLANT

## 2011-08-15 NOTE — Anesthesia Procedure Notes (Signed)
Procedure Name: Intubation Date/Time: 08/15/2011 1:12 PM Performed by: Shanon Payor Pre-anesthesia Checklist: Patient identified, Emergency Drugs available, Suction available, Timeout performed and Patient being monitored Patient Re-evaluated:Patient Re-evaluated prior to inductionOxygen Delivery Method: Circle system utilized Preoxygenation: Pre-oxygenation with 100% oxygen Intubation Type: IV induction Ventilation: Mask ventilation without difficulty Grade View: Grade II Tube type: Oral Tube size: 7.0 mm Number of attempts: 1 Airway Equipment and Method: Stylet Placement Confirmation: ETT inserted through vocal cords under direct vision,  breath sounds checked- equal and bilateral and positive ETCO2 Secured at: 20 cm Tube secured with: Tape Dental Injury: Teeth and Oropharynx as per pre-operative assessment

## 2011-08-15 NOTE — Anesthesia Postprocedure Evaluation (Signed)
  Anesthesia Post-op Note  Patient: Megan Burch  Procedure(s) Performed: Procedure(s) (LRB): ROBOTIC ASSISTED TOTAL HYSTERECTOMY (N/A) BILATERAL SALPINGECTOMY (Bilateral) ROBOTIC ASSISTED LAPAROSCOPIC LYSIS OF ADHESION (N/A)  Patient Location: PACU  Anesthesia Type: General  Level of Consciousness: awake, alert  and oriented  Airway and Oxygen Therapy: Patient Spontanous Breathing  Post-op Pain: mild  Post-op Assessment: Post-op Vital signs reviewed, Patient's Cardiovascular Status Stable, Respiratory Function Stable, Patent Airway, No signs of Nausea or vomiting and Pain level controlled  Post-op Vital Signs: Reviewed and stable  Complications: No apparent anesthesia complications

## 2011-08-15 NOTE — Anesthesia Preprocedure Evaluation (Signed)

## 2011-08-15 NOTE — Brief Op Note (Signed)
08/15/2011  4:28 PM  PATIENT:  Megan Burch  41 y.o. female  PRE-OPERATIVE DIAGNOSIS:  Menometrorrhagia, Severe anemia, Previous C/S x 3.  POST-OPERATIVE DIAGNOSIS:  menometrorrhagia, severe anemia, previous C/S x 3, pelvic adhesions  PROCEDURE:  Procedure(s) (LRB): DAVINCI ROBOTIC ASSISTED TOTAL HYSTERECTOMY (N/A) BILATERAL SALPINGECTOMY (Bilateral) ROBOTIC ASSISTED LAPAROSCOPIC LYSIS OF ADHESION (N/A)  SURGEON:  Surgeon(s) and Role:    * Serita Kyle, MD - Primary    * Kelly A. Ernestina Penna, MD - Assisting  PHYSICIAN ASSISTANT:   ASSISTANTS: Noland Fordyce. MD ANESTHESIA:   general FINDINGS:  Bladder adhesions, nl ureters bilat. Enlarged uterus, surg separated tubes, omental adhesion to ant abd wall and left side of bladder peritoneum, nl ovaries EBL:  Total I/O In: 1550 [I.V.:1300; Other:250] Out: 250 [Urine:150; Blood:100]  BLOOD ADMINISTERED:none  DRAINS: none   LOCAL MEDICATIONS USED:  MARCAINE     SPECIMEN:  Source of Specimen:  uterus, tubes  DISPOSITION OF SPECIMEN:  PATHOLOGY  COUNTS:  YES  TOURNIQUET:  * No tourniquets in log *  DICTATION: .Other Dictation: Dictation Number   PLAN OF CARE: Admit for overnight observation  PATIENT DISPOSITION:  PACU - hemodynamically stable.   Delay start of Pharmacological VTE agent (>24hrs) due to surgical blood loss or risk of bleeding: no

## 2011-08-15 NOTE — Transfer of Care (Signed)
Immediate Anesthesia Transfer of Care Note  Patient: Megan Burch  Procedure(s) Performed: Procedure(s) (LRB): ROBOTIC ASSISTED TOTAL HYSTERECTOMY (N/A) BILATERAL SALPINGECTOMY (Bilateral) ROBOTIC ASSISTED LAPAROSCOPIC LYSIS OF ADHESION (N/A)  Patient Location: PACU  Anesthesia Type: General  Level of Consciousness: sedated, patient cooperative and responds to stimulation  Airway & Oxygen Therapy: Patient Spontanous Breathing and Patient connected to nasal cannula oxygen  Post-op Assessment: Report given to PACU RN, Post -op Vital signs reviewed and stable and Patient moving all extremities  Post vital signs: Reviewed and stable  Complications: No apparent anesthesia complications

## 2011-08-16 LAB — CBC
Hemoglobin: 10.8 g/dL — ABNORMAL LOW (ref 12.0–15.0)
MCH: 25.6 pg — ABNORMAL LOW (ref 26.0–34.0)
RBC: 4.22 MIL/uL (ref 3.87–5.11)

## 2011-08-16 LAB — BASIC METABOLIC PANEL
CO2: 25 mEq/L (ref 19–32)
Calcium: 8.4 mg/dL (ref 8.4–10.5)
GFR calc Af Amer: >90 mL/min (ref >90)
Glucose, Bld: 143 mg/dL — ABNORMAL HIGH (ref 70–99)
Sodium: 137 mEq/L (ref 135–145)

## 2011-08-16 MED ORDER — IBUPROFEN 800 MG PO TABS: 800.0000 mg | ORAL_TABLET | Freq: Three times a day (TID) | ORAL | Status: AC | PRN

## 2011-08-16 MED ORDER — OXYCODONE-ACETAMINOPHEN 5-325 MG PO TABS: 1.0000 | ORAL_TABLET | ORAL | Status: AC | PRN

## 2011-08-16 NOTE — Progress Notes (Signed)
Pt teaching complete  Ambulated out   

## 2011-08-16 NOTE — Op Note (Signed)
Megan, Burch                 ACCOUNT NO.:  [id number]  MEDICAL RECORD NO.:  [id number]  LOCATION:  9306                          FACILITY:  [facility]  PHYSICIAN:  Maxie Better, M.D.DATE OF BIRTH:  12-08-1970  DATE OF PROCEDURE:  08/15/2011 DATE OF DISCHARGE:                              OPERATIVE REPORT   PREOPERATIVE DIAGNOSES:  Menometrorrhagia, iron-deficiency anemia, previous cesarean section x3.  PROCEDURE:  Da Vinci robotic total hysterectomy, bilateral salpingectomy, lysis of adhesions.  POSTOPERATIVE DIAGNOSES:  Pelvic adhesions, menometrorrhagia, iron-deficiency anemia, previous cesarean section x3.  ANESTHESIA:  General.  SURGEON:  Maxie Better, M.D.  ASSISTANT:  Noland Fordyce, MD.  DESCRIPTION OF THE PROCEDURE:  Under adequate general anesthesia, the patient was placed in the dorsal lithotomy position and positioned in accordance to robotic surgery.  Examination under anesthesia revealed anteverted uterus about 10-week size.  No adnexal masses could be appreciated.  The patient was then sterilely prepped and draped in the usual fashion using ChloraPrep as well as Betadine.  A three-way Foley catheter was placed sterilely.  A bivalve speculum was placed in the vagina.  A figure-of-eight 0 Vicryl suture was placed on the anterior and posterior lip of the cervix.  The cervix was then serially dilated with [facility] dilators and uterus sounded to 9 cm.  Small RUMI cup with #8 uterine manipulator balloon catheter was then inserted.  This was placed without difficulty.  Attention was then turned to the abdomen after the bivalve speculum was removed.  A 0.25% Marcaine was injected supraumbilically.  A supraumbilical vertical incision was then made. Veress needle was introduced and tested opening pressure of 7 was noted. Ultimately 3 L of CO2 was insufflated.  Veress needle was then removed. A 12-mm disposable trocar was introduced into the abdomen  without incident.  The robotic camera was then placed through that port.  Inspection confirmed entering the abdomen without incident.  There was evidence of omental adhesion to the anterior abdominal wall below the umbilicus and extended into the left lower quadrant anteriorly near the Bladder peritoneum.  The additional port sites were then placed after the abdomen was further distended.  Two 8-mm robotic cannula ports were placed hand's breadth away from each other on the left and one 8-mm robotic port that was placed on the right of the robotic camera site and then a 5-mm assistant port was placed in the right lower quadrant.  Once these were placed, the robot was then docked to the patient's left side and attached to these cannula port sites.  At that point, the Prograsper was placed in the arm #3, the PK dissector was placed in arm #2, and the monopolar scissors placed in arm #1.  I then went to the surgical console.  At the surgical console, using the PK dissector and the monopolar scissors the adhesions were then taken down off the anterior abdominal wall and the attachment to the left aspect of the bladder reflection.  Bladder had clearly evidence of prior cesarean section.  Posteriorly, there was no evidence of endometriosis.  Both tubes noted to have prior evidence of surgical separation.  Both ovaries were normal.  Both ureters were seen peristalsing  deep in the pelvis.  The procedure was began by clamping, cauterizing, and cutting the round ligament on the right, followed by serial clamping of the mesosalpinx on the right separating the fallopian tube.  Ultimately, the right utero-ovarian ligament was then clamped, cauterized, and cut.  The anterior leaf of the broad ligament was opened, carried down partially to the bladder which was adherent to the lower uterine segment.  Posteriorly, the posterior leaf of the broad ligament was opened as well.  The uterine vessels were  tortuous but noted to be present.  Attention was then turned to the bladder retraction.  Initial dissection did not result in much descent of the bladder.  The bladder was retro filled with dilute methylene blue solution and continued dissection was then performed.  The bladder was deflated and then re-insufflated as needed as more dissection was being performed. Once this was done and the bladder was finally taken off the lower uterine segment with much dissection and refilled subsequently to confirm that there was no injury, the attention was then turned to the left side where the left round ligament was clamped, cauterized, and then cut.  The distal portion of the left fallopian tube had been completely separated and that was taken off using the mesosalpinx to be serially clamped with Prograsper and clamped, cauterized, and then subsequently cut.  The proximal stump of the left fallopian tube did not require any separation as it was attached to the uterus which was also going to be removed.  The left utero-ovarian ligament was then clamped, cauterized, and then cut.  The adherent bladder area on the left was then addressed.  Posteriorly, the posterior leaf of the broad ligament was opened and the uterine vessels were skeletonized on that side.  The right uterine vessels had been cauterized but not cut.  On the left, these were then subsequently cauterized.  Blanching of the uterus was subsequently noted.  Attention was then turned back to the right side. Further cauterization was done on the uterine vessels, which were then subsequently cut, and then the left side uterine vessels were also cut as well.  The anterior colpotomy was then performed and carried around circumferentially using the monopolar scissors.  Once this was done completely, the uterus was separated from its vaginal attachment.  It was then removed vaginally as well as the individual piece of the left fallopian tube  was also taken out through the vagina.  The vaginal cuff posteriorly had some bleeding noted which was then cauterized.  Using the supraumbilical site, needles were then placed in the abdomen and 0 Vicryl figure-of-eight sutures were placed to close the vaginal cuff. The cuff was then examined digitally and was well approximated. Once this was performed, the pelvis was irrigated and suctioned.  Good hemostasis noted, then there was bleeding noted where the bladder had been dissected.  Cauterization was then performed for good hemostasis to be subsequently achieved.  The liver edge was noted to be normal.  The abdomen was suctioned of the fluid in the upper gutters.  Once the ovarian pedicles were noted to be hemostased, the procedure was felt to be complete at that point. The robot was undocked. The needles were then removed through the umbilical site using the 8 mmm robotic camera.  The port sites were then removed under direct visualization.  The supraumbilical site was then removed.     The abdomen was deflated. The port sites were then removed.  The abdomen was then deflated.  The vaginal cuff had been checked f well approximation prior to removing the port sites.  The port sites were then closed with 4-0 Vicryl subcuticular stitches.  The rectus fascia was identified supraumbilically and closed with 0 Vicryl figure-of-eight suture and the Skin incisions were approximated well with 4-0 Vicryl sutures.  The vagina was then digitally again examined and was well approximated. Specimen was uterus and cervix, both fallopian tubes sent to pathology. Estimated blood loss was 100 mL.  Intraoperative fluid 1300 mL.  Urine output 150 mL.  Blue-tinged urine.  Sponge and instrument counts x2 was correct.  Complication was none.  Weight of the uterus was 158.9 grams. The patient tolerated procedure well, was transferred to the recovery room in stable condition.     Maxie Better,  M.D.     Zuehl/MEDQ  D:  08/15/2011  T:  08/16/2011  Job:  [id number]

## 2011-08-16 NOTE — Discharge Instructions (Signed)
Call if temperature greater than equal to 100.4, nothing per vagina for 4-6 weeks or severe nausea vomiting, increased incisional pain , drainage or redness in the incision site, no straining with bowel movements, showers no bath °

## 2011-08-16 NOTE — Progress Notes (Signed)
Subjective: Patient reports tolerating PO and no problems voiding.    Objective: I have reviewed patient's vital signs.  vital signs, intake and output and labs. Filed Vitals:   08/16/11 1000  BP: 109/67  Pulse: 106  Temp: 98.6 F (37 C)  Resp: 18   I/O last 3 completed shifts: In: 4074.5 [P.O.:600; I.V.:3124.5; Other:250; IV Piggyback:100] Out: 1435 [Urine:1335; Blood:100] Total I/O In: -  Out: 250 [Urine:250]  Lab Results  Component Value Date   WBC 14.0* 08/16/2011   HGB 10.8* 08/16/2011   HCT 32.1* 08/16/2011   MCV 76.1* 08/16/2011   PLT 324 08/16/2011   Lab Results  Component Value Date   CREATININE 0.62 08/16/2011    EXAM General: alert, cooperative and no distress Resp: clear to auscultation bilaterally Cardio: regular rate and rhythm, S1, S2 normal, no murmur, click, rub or gallop GI: soft, non-tender; bowel sounds normal; no masses,  no organomegaly Extremities: extremities normal, atraumatic, no cyanosis or edema Vaginal Bleeding: none. Incisions well approximated (+) dermabond Reviewed intraop findings Assessment: s/p Procedure(s): DAVINCI ROBOTIC ASSISTED TOTAL HYSTERECTOMY BILATERAL SALPINGECTOMY ROBOTIC ASSISTED LAPAROSCOPIC LYSIS OF ADHESION: stable, progressing well, tolerating diet and anemia  Plan: Advance to PO medication Discontinue IV fluids Discharge home  LOS: 1 day    Ameir Faria A, MD 08/16/2011 1:40 PM    08/16/2011, 1:40 PM

## 2011-08-16 NOTE — Discharge Summary (Signed)
Physician Discharge Summary  Patient ID: Megan Burch MRN: [id number] DOB/AGE: [birth year] 41 y.o.  Admit date: 08/15/2011 Discharge date: 08/16/2011  Admission Diagnoses: menometrorrhagia, previous C/S x 3  Discharge Diagnoses:  Same, pelvic adhesions Active Problems:  * No active hospital problems. *    Discharged Condition: stable  Hospital Course: pt underwent davinci robotic TLH w/ bilateral salpingectomy, LOA. Uncomplicated postop course  Consults: None  Significant Diagnostic Studies: labs: hgb 10  Treatments: surgery: Davinci robotic TLH, bilateral salpingectomy, LOA  Discharge Exam: Blood pressure 109/67, pulse 106, temperature 98.6 F (37 C), temperature source Oral, resp. rate 18, height 5\' 2"  (1.575 m), weight 82.555 kg (182 lb), SpO2 98.00%. General appearance: alert, cooperative and no distress Resp: clear to auscultation bilaterally Breasts: normal appearance, no masses or tenderness, Normal to palpation without dominant masses Cardio: regular rate and rhythm GI: soft, non-tender; bowel sounds normal; no masses,  no organomegaly Pelvic: no pad Extremities: extremities normal, atraumatic, no cyanosis or edema and no edema, redness or tenderness in the calves or thighs Incision/Wound:  Disposition: 01-Home or Self Care   Medication List  As of 08/16/2011  1:40 PM   ASK your doctor about these medications         cetirizine 10 MG tablet   Commonly known as: ZYRTEC   Take 10 mg by mouth 2 (two) times daily as needed. For allergies      ferrous sulfate 220 (44 FE) MG/5ML solution   Take 220 mg by mouth daily.      fish oil-omega-3 fatty acids 1000 MG capsule   Take 1 g by mouth every morning.      ibuprofen 200 MG tablet   Commonly known as: ADVIL,MOTRIN   Take 400-600 mg by mouth every 6 (six) hours as needed. For pain/headache      OVER THE COUNTER MEDICATION   Take 1 capsule by mouth 2 (two) times daily. Ferritin iron supplement      VITAMIN B12 PO    Take 1 tablet by mouth every morning.           Follow-up Information    Follow up with Darthy Manganelli A, MD in 2 weeks.   Contact information:   [address] [place] [address] [phone]          Signed: Serita Kyle 08/16/2011, 1:40 PM

## 2013-06-17 ENCOUNTER — Other Ambulatory Visit: Payer: Self-pay

## 2013-06-17 ENCOUNTER — Other Ambulatory Visit: Payer: Self-pay | Admitting: Obstetrics and Gynecology

## 2013-06-17 DIAGNOSIS — Z1231 Encounter for screening mammogram for malignant neoplasm of breast: Secondary | ICD-10-CM

## 2013-06-23 ENCOUNTER — Ambulatory Visit (INDEPENDENT_AMBULATORY_CARE_PROVIDER_SITE_OTHER): Payer: 59 | Admitting: Family Medicine

## 2013-06-23 VITALS — BP 122/70 | HR 95 | Temp 98.9°F | Resp 16 | Ht 66.0 in | Wt 184.4 lb

## 2013-06-23 DIAGNOSIS — R51 Headache: Secondary | ICD-10-CM

## 2013-06-23 DIAGNOSIS — N898 Other specified noninflammatory disorders of vagina: Secondary | ICD-10-CM

## 2013-06-23 DIAGNOSIS — G4700 Insomnia, unspecified: Secondary | ICD-10-CM

## 2013-06-23 DIAGNOSIS — F411 Generalized anxiety disorder: Secondary | ICD-10-CM

## 2013-06-23 DIAGNOSIS — R8290 Unspecified abnormal findings in urine: Secondary | ICD-10-CM

## 2013-06-23 DIAGNOSIS — R82998 Other abnormal findings in urine: Secondary | ICD-10-CM

## 2013-06-23 DIAGNOSIS — F419 Anxiety disorder, unspecified: Secondary | ICD-10-CM

## 2013-06-23 LAB — POCT CBC
Granulocyte percent: 50.8 % (ref 37–80)
HCT, POC: 42 % (ref 37.7–47.9)
Hemoglobin: 13.9 g/dL (ref 12.2–16.2)
Lymph, poc: 3.2 (ref 0.6–3.4)
MCH, POC: 27.8 pg (ref 27–31.2)
MCHC: 33.1 g/dL (ref 31.8–35.4)
MCV: 84.1 fL (ref 80–97)
MID (cbc): 0.7 (ref 0–0.9)
MPV: 8.5 fL (ref 0–99.8)
POC Granulocyte: 4 (ref 2–6.9)
POC LYMPH PERCENT: 40.1 %L (ref 10–50)
POC MID %: 9.1 % (ref 0–12)
Platelet Count, POC: 419 10*3/uL (ref 142–424)
RBC: 5 M/uL (ref 4.04–5.48)
RDW, POC: 15.1 %
WBC: 7.9 10*3/uL (ref 4.6–10.2)

## 2013-06-23 LAB — POCT URINALYSIS DIPSTICK
Bilirubin, UA: NEGATIVE
Glucose, UA: NEGATIVE
Ketones, UA: NEGATIVE
Leukocytes, UA: NEGATIVE
Nitrite, UA: NEGATIVE
Protein, UA: NEGATIVE
Spec Grav, UA: 1.02
Urobilinogen, UA: 0.2
pH, UA: 7

## 2013-06-23 LAB — POCT UA - MICROSCOPIC ONLY
Casts, Ur, LPF, POC: NEGATIVE
Crystals, Ur, HPF, POC: NEGATIVE
Mucus, UA: NEGATIVE
WBC, Ur, HPF, POC: NEGATIVE
Yeast, UA: NEGATIVE

## 2013-06-23 LAB — POCT WET PREP WITH KOH
Clue Cells Wet Prep HPF POC: NEGATIVE
KOH Prep POC: NEGATIVE
RBC Wet Prep HPF POC: NEGATIVE
Trichomonas, UA: NEGATIVE
Yeast Wet Prep HPF POC: NEGATIVE

## 2013-06-23 MED ORDER — SERTRALINE HCL 25 MG PO TABS: 25.0000 mg | ORAL_TABLET | Freq: Every day | ORAL | Status: DC

## 2013-06-23 MED ORDER — HYDROXYZINE HCL 10 MG PO TABS: 10.0000 mg | ORAL_TABLET | Freq: Three times a day (TID) | ORAL | Status: DC | PRN

## 2013-06-23 NOTE — Progress Notes (Signed)
 Chief Complaint:  Chief Complaint  Patient presents with  . Anxiety    worse over the past 2 weeks, feels overwhelmed.  . Insomnia  . Headache    HPI: Megan Burch is a 43 y.o. female who is here for  2-3 week history of feeling worsening anxiety, and also stress since her 45 year old toddler is going back to school. She has an 45 y/o, 45 y/o and 45 yr old. 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 She did not have post partumd epression but thinks she may be feeling overwhelmed with all the transitions. She is finding it difficult to leave her 45 yr old for long periods of time, she is having a hard time thinking about her 45 y/o leaving for college and trying to maneuver theschool drop off and pick s when that happens. She does not want to quit her job, it is a good job, she has been there for a while and is getting older and does not want to leave. She has sleep issues because of her schedule and the anxiety over this.   Past Medical History  Diagnosis Date  . No pertinent past medical history   . Blood transfusion 07/13/11    Gray  . Anemia     Had blood transfussion APril 2013   Past Surgical History  Procedure Laterality Date  . Cesarean section    . Diagnostic laparoscopy    . Abdominal hysterectomy     History   Social History  . Marital Status: Married    Spouse Name: N/A    Number of Children: N/A  . Years of Education: N/A   Social History Main Topics  . Smoking status: Never Smoker   . Smokeless tobacco: Never Used  . Alcohol Use: No  . Drug Use: No  . Sexual Activity: Yes    Birth Control/ Protection: None   Other Topics Concern  . None   Social History Narrative  . None   History reviewed. No pertinent family history. No Known Allergies Prior to Admission medications   Medication Sig Start Date End Date Taking? Authorizing Provider  cetirizine  (ZYRTEC) 10 MG tablet Take 10 mg by mouth 2 (two) times daily as needed. For allergies   Yes Historical Provider, MD  Cyanocobalamin (VITAMIN B12 PO) Take 1 tablet by mouth every morning.   Yes Historical Provider, MD  ferrous sulfate 220 (44 FE) MG/5ML solution Take 220 mg by mouth daily.   Yes Historical Provider, MD  fish oil-omega-3 fatty acids 1000 MG capsule Take 1 g by mouth every morning.   Yes Historical Provider, MD  OVER THE COUNTER MEDICATION Take 1 capsule by mouth 2 (two) times daily. Ferritin iron supplement   Yes Historical Provider, MD     ROS: The patient denies fevers, chills, night sweats, unintentional weight loss, chest pain, palpitations, wheezing, dyspnea on exertion, nausea, vomiting, abdominal pain, dysuria, hematuria, melena, numbness, weakness, or tingling.   All other systems have been reviewed and were otherwise negative with the exception of those mentioned in the HPI and as above.    PHYSICAL EXAM: Filed Vitals:   06/23/13 1728  BP: 122/70  Pulse: 95  Temp: 98.9 F (37.2 C)  Resp: 16   Filed Vitals:   06/23/13 1728  Height: 5\' 6"  (1.676 m)  Weight: 184 lb  6.4 oz (83.643 kg)   Body mass index is 29.78 kg/(m^2).  General: Alert, no acute distress HEENT:  Normocephalic, atraumatic, oropharynx patent. EOMI, PERRLA. No thryoid megaly Cardiovascular:  Regular rate and rhythm, no rubs murmurs or gallops.  No Carotid bruits, radial pulse intact. No pedal edema.  Respiratory: Clear to auscultation bilaterally.  No wheezes, rales, or rhonchi.  No cyanosis, no use of accessory musculature GI: No organomegaly, abdomen is soft and non-tender, positive bowel sounds.  No masses. Skin: No rashes. Neurologic: Facial musculature symmetric. Psychiatric: Patient is appropriate throughout our interaction. Lymphatic: No cervical lymphadenopathy Musculoskeletal: Gait intact.   LABS: Results for orders placed in visit on 06/23/13  POCT CBC      Result Value Ref  Range   WBC 7.9  4.6 - 10.2 K/uL   Lymph, poc 3.2  0.6 - 3.4   POC LYMPH PERCENT 40.1  10 - 50 %L   MID (cbc) 0.7  0 - 0.9   POC MID % 9.1  0 - 12 %M   POC Granulocyte 4.0  2 - 6.9   Granulocyte percent 50.8  37 - 80 %G   RBC 5.00  4.04 - 5.48 M/uL   Hemoglobin 13.9  12.2 - 16.2 g/dL   HCT, POC 42.0  37.7 - 47.9 %   MCV 84.1  80 - 97 fL   MCH, POC 27.8  27 - 31.2 pg   MCHC 33.1  31.8 - 35.4 g/dL   RDW, POC 15.1     Platelet Count, POC 419  142 - 424 K/uL   MPV 8.5  0 - 99.8 fL  POCT UA - MICROSCOPIC ONLY      Result Value Ref Range   WBC, Ur, HPF, POC neg     RBC, urine, microscopic 0-2     Bacteria, U Microscopic 1+     Mucus, UA neg     Epithelial cells, urine per micros 0-3     Crystals, Ur, HPF, POC neg     Casts, Ur, LPF, POC neg     Yeast, UA neg    POCT URINALYSIS DIPSTICK      Result Value Ref Range   Color, UA yellow     Clarity, UA clear     Glucose, UA neg     Bilirubin, UA neg     Ketones, UA neg     Spec Grav, UA 1.020     Blood, UA trace-intact     pH, UA 7.0     Protein, UA neg     Urobilinogen, UA 0.2     Nitrite, UA neg     Leukocytes, UA Negative    POCT WET PREP WITH KOH      Result Value Ref Range   Trichomonas, UA Negative     Clue Cells Wet Prep HPF POC neg     Epithelial Wet Prep HPF POC 7-16     Yeast Wet Prep HPF POC neg     Bacteria Wet Prep HPF POC 1+     RBC Wet Prep HPF POC neg     WBC Wet Prep HPF POC 0-2     KOH Prep POC Negative       EKG/XRAY:   Primary read interpreted by Dr. Marin Comment at [facility].   ASSESSMENT/PLAN: Encounter Diagnoses  Name Primary?  . Abnormal urine odor Yes  . Anxiety   . Insomnia   . Headache(784.0)   . Vaginal discharge    WIll  get labs,  If normal then start meds.  Will call and start zoloft and vistaril, rxs given to paitent F/u in 6 weeks  Gross sideeffects, risk and benefits, and alternatives of medications d/w patient. Patient is aware that all medications have potential sideeffects and we are  unable to predict every sideeffect or drug-drug interaction that may occur.  , [place], DO 06/24/2013 9:09 AM

## 2013-06-24 LAB — COMPREHENSIVE METABOLIC PANEL
AST: 18 U/L (ref 0–37)
Albumin: 4.2 g/dL (ref 3.5–5.2)
Alkaline Phosphatase: 80 U/L (ref 39–117)
BUN: 9 mg/dL (ref 6–23)
Creat: 0.63 mg/dL (ref 0.50–1.10)
Glucose, Bld: 84 mg/dL (ref 70–99)
Potassium: 3.9 mEq/L (ref 3.5–5.3)
Total Bilirubin: 0.3 mg/dL (ref 0.2–1.2)

## 2013-06-24 LAB — COMPREHENSIVE METABOLIC PANEL WITH GFR
ALT: 12 U/L (ref 0–35)
CO2: 27 meq/L (ref 19–32)
Calcium: 9.6 mg/dL (ref 8.4–10.5)
Chloride: 103 meq/L (ref 96–112)
Sodium: 139 meq/L (ref 135–145)
Total Protein: 7.1 g/dL (ref 6.0–8.3)

## 2013-06-24 LAB — TSH: TSH: 0.985 u[IU]/mL (ref 0.350–4.500)

## 2013-06-25 ENCOUNTER — Telehealth: Payer: Self-pay | Admitting: Family Medicine

## 2013-06-25 NOTE — Telephone Encounter (Signed)
LM that labs are normal so can start meds I gave her, f/u in 1 month

## 2013-06-26 ENCOUNTER — Encounter: Payer: Self-pay | Admitting: Family Medicine

## 2013-07-16 ENCOUNTER — Ambulatory Visit: Payer: 59

## 2013-07-25 ENCOUNTER — Ambulatory Visit (INDEPENDENT_AMBULATORY_CARE_PROVIDER_SITE_OTHER): Payer: 59

## 2013-07-25 DIAGNOSIS — Z1231 Encounter for screening mammogram for malignant neoplasm of breast: Secondary | ICD-10-CM

## 2013-08-13 ENCOUNTER — Ambulatory Visit (INDEPENDENT_AMBULATORY_CARE_PROVIDER_SITE_OTHER): Payer: 59 | Admitting: Physician Assistant

## 2013-08-13 VITALS — BP 116/72 | HR 82 | Temp 98.0°F | Resp 16 | Ht 66.0 in | Wt 187.0 lb

## 2013-08-13 DIAGNOSIS — J329 Chronic sinusitis, unspecified: Secondary | ICD-10-CM

## 2013-08-13 MED ORDER — GUAIFENESIN ER 1200 MG PO TB12: 1.0000 | ORAL_TABLET | Freq: Two times a day (BID) | ORAL | Status: DC | PRN

## 2013-08-13 MED ORDER — IPRATROPIUM BROMIDE 0.03 % NA SOLN: 2.0000 | Freq: Two times a day (BID) | NASAL | Status: DC

## 2013-08-13 MED ORDER — AMOXICILLIN 875 MG PO TABS: 1750.0000 mg | ORAL_TABLET | Freq: Two times a day (BID) | ORAL | Status: DC

## 2013-08-13 NOTE — Progress Notes (Signed)
Subjective:    Patient ID: Megan Burch, female    DOB: June 16, 1970, 43 y.o.   MRN: [id number]  Cough Associated symptoms include ear pain, postnasal drip and a sore throat. Pertinent negatives include no chills, fever, rash, rhinorrhea, shortness of breath or wheezing.  Sore Throat  Associated symptoms include congestion, coughing and ear pain. Pertinent negatives include no shortness of breath.   42y.o female presents with 4 days of congestion sore throat.  Has since progressed to post nasal drainage with productive cough of yellow mucus.  Has tried Copywriter, advertising with minimal relief.  Pt taking daily zyrtec for seasonal allergies.  Husband and 2 of 3 children have been medically evaluated and treated with abx for sinus infection.  Family members experienced same symptoms as pt.  Denies fever, N/V/D, SOB.  Pt also notes neck stiffness with lateral movement starting 3 days ago.  Denies photophobia, fever, confusion, dizziness.     Review of Systems  Constitutional: Negative for fever, chills, diaphoresis and appetite change.  HENT: Positive for congestion, ear pain, postnasal drip, sinus pressure and sore throat. Negative for rhinorrhea and sneezing.   Eyes: Negative for photophobia and visual disturbance.  Respiratory: Positive for cough. Negative for shortness of breath and wheezing.   Cardiovascular: Negative.   Gastrointestinal: Negative.   Endocrine: Negative.   Genitourinary: Negative.   Musculoskeletal: Positive for neck stiffness. Negative for arthralgias, back pain and gait problem.  Skin: Negative for rash.  Allergic/Immunologic: Negative.   Neurological: Negative for dizziness, weakness, light-headedness and numbness.  Hematological: Negative.        Objective:   Physical Exam  Constitutional: She is oriented to person, place, and time. She appears well-developed and well-nourished. No distress.  BP 116/72  Pulse 82  Temp(Src) 98 F (36.7 C) (Oral)  Resp 16  Ht 5\' 6"   (1.676 m)  Wt 187 lb (84.823 kg)  BMI 30.20 kg/m2  SpO2 98%  LMP 06/28/2011   HENT:  Head: Normocephalic.  Right Ear: External ear normal.  Left Ear: External ear normal.  Nose: Mucosal edema present.  Mouth/Throat: Uvula is midline. Posterior oropharyngeal erythema present. No oropharyngeal exudate or posterior oropharyngeal edema.  Eyes: Conjunctivae are normal. Pupils are equal, round, and reactive to light.  Neck: No tracheal deviation present.  Cardiovascular: Normal rate, regular rhythm, normal heart sounds and intact distal pulses.  Exam reveals no gallop and no friction rub.   No murmur heard. Pulmonary/Chest: Effort normal and breath sounds normal. No respiratory distress. She has no wheezes. She exhibits no tenderness.  Musculoskeletal:  Normal ROM with neck flexion and extension.  No tenderness to palpation midline of cervical spine.  Tenderness to palpation along trapezius bilat  Lymphadenopathy:    She has cervical adenopathy.  Neurological: She is alert and oriented to person, place, and time.  Skin: Skin is warm and dry. No rash noted.  Psychiatric: She has a normal mood and affect. Her behavior is normal.          Assessment & Plan:   1. Sinusitis Given atrovent and mucinex to allow easier drainage of sinuses.  Pt will return if symptoms worsen. - amoxicillin (AMOXIL) 875 MG tablet; Take 2 tablets (1,750 mg total) by mouth 2 (two) times daily.  Dispense: 20 tablet; Refill: 0 - ipratropium (ATROVENT) 0.03 % nasal spray; Place 2 sprays into both nostrils 2 (two) times daily.  Dispense: 30 mL; Refill: 0 - Guaifenesin (MUCINEX MAXIMUM STRENGTH) 1200 MG TB12; Take 1 tablet (  1,200 mg total) by mouth every 12 (twelve) hours as needed.  Dispense: 14 tablet; Refill: 1

## 2013-08-13 NOTE — Patient Instructions (Signed)
Try to get plenty of rest and drink at least 64oz daily  Make sure to complete the 5 days of amoxicillin  Use the atrovent nasal spray to help open your passages to allow for easier drainage.  Take mucinex to thin the mucus and make it easier for your sinuses to drain.

## 2013-08-13 NOTE — Progress Notes (Signed)
I have examined this patient along with the student and agree.  

## 2013-08-16 ENCOUNTER — Ambulatory Visit (INDEPENDENT_AMBULATORY_CARE_PROVIDER_SITE_OTHER): Payer: 59 | Admitting: Family Medicine

## 2013-08-16 ENCOUNTER — Encounter: Payer: Self-pay | Admitting: Family Medicine

## 2013-08-16 ENCOUNTER — Encounter: Payer: 59 | Admitting: Family Medicine

## 2013-08-16 VITALS — BP 112/78 | HR 73 | Temp 98.2°F | Resp 16 | Ht 62.5 in | Wt 184.4 lb

## 2013-08-16 DIAGNOSIS — R5383 Other fatigue: Secondary | ICD-10-CM

## 2013-08-16 DIAGNOSIS — R5381 Other malaise: Secondary | ICD-10-CM

## 2013-08-16 DIAGNOSIS — Z0000 Encounter for general adult medical examination without abnormal findings: Secondary | ICD-10-CM

## 2013-08-16 DIAGNOSIS — R635 Abnormal weight gain: Secondary | ICD-10-CM

## 2013-08-16 LAB — VITAMIN B12: Vitamin B-12: 956 pg/mL — ABNORMAL HIGH (ref 211–911)

## 2013-08-16 LAB — LIPID PANEL
Cholesterol: 202 mg/dL — ABNORMAL HIGH (ref 0–200)
HDL: 46 mg/dL (ref >39)
LDL Cholesterol: 142 mg/dL — ABNORMAL HIGH (ref 0–99)
Total CHOL/HDL Ratio: 4.4 Ratio
Triglycerides: 72 mg/dL (ref <150)
VLDL: 14 mg/dL (ref 0–40)

## 2013-08-16 LAB — COMPREHENSIVE METABOLIC PANEL WITH GFR
ALT: 12 U/L (ref 0–35)
AST: 18 U/L (ref 0–37)
Albumin: 4.3 g/dL (ref 3.5–5.2)
BUN: 7 mg/dL (ref 6–23)
Calcium: 9.1 mg/dL (ref 8.4–10.5)
Chloride: 101 meq/L (ref 96–112)
Potassium: 3.9 meq/L (ref 3.5–5.3)
Sodium: 139 meq/L (ref 135–145)
Total Protein: 7.3 g/dL (ref 6.0–8.3)

## 2013-08-16 LAB — CBC
HCT: 42.9 % (ref 36.0–46.0)
Hemoglobin: 15.2 g/dL — ABNORMAL HIGH (ref 12.0–15.0)
MCH: 27.9 pg (ref 26.0–34.0)
MCHC: 35.4 g/dL (ref 30.0–36.0)
MCV: 78.7 fL (ref 78.0–100.0)
Platelets: 344 10*3/uL (ref 150–400)
RBC: 5.45 MIL/uL — ABNORMAL HIGH (ref 3.87–5.11)
RDW: 14.7 % (ref 11.5–15.5)
WBC: 6.9 10*3/uL (ref 4.0–10.5)

## 2013-08-16 LAB — COMPREHENSIVE METABOLIC PANEL
Alkaline Phosphatase: 83 U/L (ref 39–117)
CO2: 29 mEq/L (ref 19–32)
Creat: 0.65 mg/dL (ref 0.50–1.10)
Glucose, Bld: 78 mg/dL (ref 70–99)
Total Bilirubin: 0.6 mg/dL (ref 0.2–1.2)

## 2013-08-16 NOTE — Progress Notes (Signed)
Chief Complaint:  Chief Complaint  Patient presents with  . Annual Exam    HPI: Megan Burch is a 43 y.o. female who is here for  Annual and also checkup on her depression, stress induced anxiety. She is G3L1 , kids ages 21, 26, and 64. She was having a lot of stress since her toddler was going to preschool and trying to figure out how to do the carpool schedule and big events. She was not handling the scheduling stress of her childrena nd her husband and her job very well. Now things are better. Sports has stopped and things are less hectic.  Last pap was 2013, partial hysterectomy with intact ovaries. Severe anemia due to abnormal uterine fibroids, benign cause.  Last mammogram  was in March 2015, normal results.  Recent sinus  Infection, she is feeling slightly better, on Amox.   Past Medical History  Diagnosis Date  . No pertinent past medical history   . Blood transfusion 07/13/11    [place]  . Anemia     Had blood transfussion APril 2013  . Anxiety    Past Surgical History  Procedure Laterality Date  . Cesarean section    . Diagnostic laparoscopy    . Abdominal hysterectomy     History   Social History  . Marital Status: Married    Spouse Name: N/A    Number of Children: N/A  . Years of Education: N/A   Social History Main Topics  . Smoking status: Never Smoker   . Smokeless tobacco: Never Used  . Alcohol Use: No  . Drug Use: No  . Sexual Activity: Yes    Birth Control/ Protection: None   Other Topics Concern  . None   Social History Narrative   Lives with husband and 3 children   Family History  Problem Relation Age of Onset  . Diabetes Maternal Grandmother   . Hyperlipidemia Maternal Grandmother   . Stroke Paternal Grandmother   . Hyperlipidemia Paternal Grandmother   . Diabetes Paternal Grandmother   . Heart disease Paternal Grandmother    No Known Allergies Prior to Admission medications   Medication Sig Start Date End Date Taking?  Authorizing Provider  amoxicillin (AMOXIL) 875 MG tablet Take 2 tablets (1,750 mg total) by mouth 2 (two) times daily. 08/13/13  Yes Chelle S Jeffery, PA-C  cetirizine (ZYRTEC) 10 MG tablet Take 10 mg by mouth 2 (two) times daily as needed. For allergies   Yes Historical Provider, MD  Cyanocobalamin (VITAMIN B12 PO) Take 1 tablet by mouth every morning.   Yes Historical Provider, MD  ferrous sulfate 220 (44 FE) MG/5ML solution Take 220 mg by mouth daily.   Yes Historical Provider, MD  fish oil-omega-3 fatty acids 1000 MG capsule Take 1 g by mouth every morning.   Yes Historical Provider, MD  Guaifenesin (MUCINEX MAXIMUM STRENGTH) 1200 MG TB12 Take 1 tablet (1,200 mg total) by mouth every 12 (twelve) hours as needed. 08/13/13  Yes Chelle S Jeffery, PA-C  hydrOXYzine (ATARAX/VISTARIL) 10 MG tablet Take 1 tablet (10 mg total) by mouth 3 (three) times daily as needed. Do not take zyrtec if taking this 06/23/13  Yes Thao P Le, DO  ipratropium (ATROVENT) 0.03 % nasal spray Place 2 sprays into both nostrils 2 (two) times daily. 08/13/13  Yes Chelle S Jeffery, PA-C  OVER THE COUNTER MEDICATION Take 1 capsule by mouth 2 (two) times daily. Ferritin iron supplement   Yes Historical Provider,  MD  sertraline (ZOLOFT) 25 MG tablet Take 1 tablet (25 mg total) by mouth at bedtime. 06/23/13  Yes Thao P Le, DO     ROS: The patient denies fevers, chills, night sweats, unintentional weight loss, chest pain, palpitations, wheezing, dyspnea on exertion, nausea, vomiting, abdominal pain, dysuria, hematuria, melena, numbness, weakness, or tingling.   All other systems have been reviewed and were otherwise negative with the exception of those mentioned in the HPI and as above.    PHYSICAL EXAM: Filed Vitals:   08/16/13 1122  BP: 112/78  Pulse: 73  Temp: 98.2 F (36.8 C)  Resp: 16   Filed Vitals:   08/16/13 1122  Height: 5' 2.5" (1.588 m)  Weight: 184 lb 6.4 oz (83.643 kg)   Body mass index is 33.17  kg/(m^2).  General: Alert, no acute distress HEENT:  Normocephalic, atraumatic, oropharynx patent. EOMI, PERRLA, CN 2-12 grossly normal Cardiovascular:  Regular rate and rhythm, no rubs murmurs or gallops.  No Carotid bruits, radial pulse intact. No pedal edema.  Respiratory: Clear to auscultation bilaterally.  No wheezes, rales, or rhonchi.  No cyanosis, no use of accessory musculature GI: No organomegaly, abdomen is soft and non-tender, positive bowel sounds.  No masses. Skin: No rashes. Neurologic: Facial musculature symmetric. Psychiatric: Patient is appropriate throughout our interaction. Lymphatic: No cervical lymphadenopathy Musculoskeletal: Gait intact. Breast and GU normal, no cervix on exam  LABS: Results for orders placed in visit on 06/23/13  TSH      Result Value Ref Range   TSH 0.985  0.350 - 4.500 uIU/mL  COMPREHENSIVE METABOLIC PANEL      Result Value Ref Range   Sodium 139  135 - 145 mEq/L   Potassium 3.9  3.5 - 5.3 mEq/L   Chloride 103  96 - 112 mEq/L   CO2 27  19 - 32 mEq/L   Glucose, Bld 84  70 - 99 mg/dL   BUN 9  6 - 23 mg/dL   Creat 0.63  0.50 - 1.10 mg/dL   Total Bilirubin 0.3  0.2 - 1.2 mg/dL   Alkaline Phosphatase 80  39 - 117 U/L   AST 18  0 - 37 U/L   ALT 12  0 - 35 U/L   Total Protein 7.1  6.0 - 8.3 g/dL   Albumin 4.2  3.5 - 5.2 g/dL   Calcium 9.6  8.4 - 10.5 mg/dL  POCT CBC      Result Value Ref Range   WBC 7.9  4.6 - 10.2 K/uL   Lymph, poc 3.2  0.6 - 3.4   POC LYMPH PERCENT 40.1  10 - 50 %L   MID (cbc) 0.7  0 - 0.9   POC MID % 9.1  0 - 12 %M   POC Granulocyte 4.0  2 - 6.9   Granulocyte percent 50.8  37 - 80 %G   RBC 5.00  4.04 - 5.48 M/uL   Hemoglobin 13.9  12.2 - 16.2 g/dL   HCT, POC 42.0  37.7 - 47.9 %   MCV 84.1  80 - 97 fL   MCH, POC 27.8  27 - 31.2 pg   MCHC 33.1  31.8 - 35.4 g/dL   RDW, POC 15.1     Platelet Count, POC 419  142 - 424 K/uL   MPV 8.5  0 - 99.8 fL  POCT UA - MICROSCOPIC ONLY      Result Value Ref Range   WBC, Ur,  HPF, POC neg  RBC, urine, microscopic 0-2     Bacteria, U Microscopic 1+     Mucus, UA neg     Epithelial cells, urine per micros 0-3     Crystals, Ur, HPF, POC neg     Casts, Ur, LPF, POC neg     Yeast, UA neg    POCT URINALYSIS DIPSTICK      Result Value Ref Range   Color, UA yellow     Clarity, UA clear     Glucose, UA neg     Bilirubin, UA neg     Ketones, UA neg     Spec Grav, UA 1.020     Blood, UA trace-intact     pH, UA 7.0     Protein, UA neg     Urobilinogen, UA 0.2     Nitrite, UA neg     Leukocytes, UA Negative    POCT WET PREP WITH KOH      Result Value Ref Range   Trichomonas, UA Negative     Clue Cells Wet Prep HPF POC neg     Epithelial Wet Prep HPF POC 7-16     Yeast Wet Prep HPF POC neg     Bacteria Wet Prep HPF POC 1+     RBC Wet Prep HPF POC neg     WBC Wet Prep HPF POC 0-2     KOH Prep POC Negative       EKG/XRAY:   Primary read interpreted by Dr. Marin Comment at [facility].   ASSESSMENT/PLAN: Encounter Diagnoses  Name Primary?  . Annual physical exam Yes  . Other malaise and fatigue   . Weight gain    Labs pending Screening mammogram was recently, was normal, she is UTD Since she stopped taking zoloft and was only taking 1 pill weekly, I have told her to stop totally. She has less stress now, only taking vistaril prn F/u prn  Gross sideeffects, risk and benefits, and alternatives of medications d/w patient. Patient is aware that all medications have potential sideeffects and we are unable to predict every sideeffect or drug-drug interaction that may occur.  Glenford Bayley, DO 08/16/2013 1:58 PM

## 2013-08-16 NOTE — Progress Notes (Deleted)
   Subjective:    Patient ID: Megan Burch, female    DOB: February 10, 1971, 43 y.o.   MRN: [id number]  HPI    Review of Systems  Constitutional: Positive for fatigue.  HENT: Positive for congestion, rhinorrhea, sinus pressure, sneezing and sore throat.   Eyes: Negative.   Respiratory: Negative.   Cardiovascular: Negative.   Gastrointestinal: Negative.   Endocrine: Negative.   Genitourinary: Negative.   Musculoskeletal: Negative.   Skin: Negative.   Allergic/Immunologic: Negative.   Neurological: Negative.   Hematological: Negative.   Psychiatric/Behavioral: Negative.        Objective:   Physical Exam        Assessment & Plan:

## 2013-08-17 LAB — VITAMIN D 25 HYDROXY (VIT D DEFICIENCY, FRACTURES): Vit D, 25-Hydroxy: 27 ng/mL — ABNORMAL LOW (ref 30–89)

## 2013-08-27 ENCOUNTER — Encounter: Payer: Self-pay | Admitting: Family Medicine

## 2014-02-17 ENCOUNTER — Encounter: Payer: Self-pay | Admitting: Family Medicine

## 2014-05-19 ENCOUNTER — Ambulatory Visit (INDEPENDENT_AMBULATORY_CARE_PROVIDER_SITE_OTHER): Payer: 59

## 2014-05-19 ENCOUNTER — Ambulatory Visit (INDEPENDENT_AMBULATORY_CARE_PROVIDER_SITE_OTHER): Payer: 59 | Admitting: Emergency Medicine

## 2014-05-19 VITALS — BP 120/78 | HR 80 | Temp 98.5°F | Resp 18 | Ht 63.0 in | Wt 188.0 lb

## 2014-05-19 DIAGNOSIS — S62603A Fracture of unspecified phalanx of left middle finger, initial encounter for closed fracture: Secondary | ICD-10-CM

## 2014-05-19 DIAGNOSIS — W231XXA Caught, crushed, jammed, or pinched between stationary objects, initial encounter: Secondary | ICD-10-CM

## 2014-05-19 DIAGNOSIS — S62609A Fracture of unspecified phalanx of unspecified finger, initial encounter for closed fracture: Secondary | ICD-10-CM

## 2014-05-19 DIAGNOSIS — M79645 Pain in left finger(s): Secondary | ICD-10-CM

## 2014-05-19 MED ORDER — CEPHALEXIN 500 MG PO CAPS: 500.0000 mg | ORAL_CAPSULE | Freq: Three times a day (TID) | ORAL | Status: DC

## 2014-05-19 NOTE — Progress Notes (Addendum)
Subjective:  This chart was scribed for Megan Burch, by Megan Burch, at [facility]  This patient was seen in room 10 and the patient's care was started at 9:31 AM.    Patient ID: Megan Burch, female    DOB: 01/02/1971, 44 y.o.   MRN: [id number]  HPI HPI Comments: Megan Burch is a 44 y.o. female who presents to [facility] complaining of constant left middle finger pain after shutting her finger into a car door 3 days ago.  Patient has been putting peroxide onto her finger and put on a band aid for releif.  Patient has had her tetanus shot in the last 10 years.  Patient currently has no other complaints.    There are no active problems to display for this patient.  Past Medical History  Diagnosis Date  . No pertinent past medical history   . Blood transfusion 07/13/11    [place]  . Anemia     Had blood transfussion APril 2013  . Anxiety    Past Surgical History  Procedure Laterality Date  . Cesarean section    . Diagnostic laparoscopy    . Abdominal hysterectomy     No Known Allergies Prior to Admission medications   Medication Sig Start Date End Date Taking? Authorizing Provider  cetirizine (ZYRTEC) 10 MG tablet Take 10 mg by mouth 2 (two) times daily as needed. For allergies   Yes Historical Provider, Burch  Cyanocobalamin (VITAMIN B12 PO) Take 1 tablet by mouth every morning.   Yes Historical Provider, Burch  fish oil-omega-3 fatty acids 1000 MG capsule Take 1 g by mouth every morning.   Yes Historical Provider, Burch  Guaifenesin (MUCINEX MAXIMUM STRENGTH) 1200 MG TB12 Take 1 tablet (1,200 mg total) by mouth every 12 (twelve) hours as needed. 08/13/13  Yes Fara Chute, PA-C   History   Social History  . Marital Status: Married    Spouse Name: N/A    Number of Children: N/A  . Years of Education: N/A   Occupational History  . Not on file.   Social History Main Topics  . Smoking status: Never Smoker   . Smokeless  tobacco: Never Used  . Alcohol Use: No  . Drug Use: No  . Sexual Activity: Yes    Birth Control/ Protection: None   Other Topics Concern  . Not on file   Social History Narrative   Lives with husband and 3 children       Review of Systems  Constitutional: Negative for fever and chills.  Skin: Positive for wound.       Objective:   Physical Exam  CONSTITUTIONAL: Well developed/well nourished HEAD: Normocephalic/atraumatic EYES: EOMI/PERRL ENMT: Mucous membranes moist NECK: supple no meningeal signs SPINE/BACK:entire spine nontender CV: S1/S2 noted, no murmurs/rubs/gallops noted LUNGS: Lungs are clear to auscultation bilaterally, no apparent distress ABDOMEN: soft, nontender, no rebound or guarding, bowel sounds noted throughout abdomen GU:no cva tenderness NEURO: Pt is awake/alert/appropriate, moves all extremitiesx4.  No facial droop.   EXTREMITIES: She has a 1 quarter subungual hematoma beneath the nail left middle finger, no active bleeding. SKIN: warm, color normal PSYCH: no abnormalities of mood noted, alert and oriented to situation     Filed Vitals:   05/19/14 0926  BP: 120/78  Pulse: 80  Temp: 98.5 F (36.9 C)  TempSrc: Oral  Resp: 18  Height: 5\' 3"  (1.6 m)  Weight: 188 lb (85.276 kg)  SpO2:  98%   [facility] reading (PRIMARY) by  Dr.Mikhaela Zaugg there is a non displaced tuft fracture middle finger left hand  Procedure note The base of the  nail was prepped with alcohol and a 20-gauge needle was used and a small hole placed at the base of the nail with return of a tiny droplet of blood. Patient tolerated the procedure without any difficulty.       Assessment & Plan:  I personally performed the services described in this documentation, which was scribed in my presence. The recorded information has been reviewed and is accurate. Patient has a tuft fracture with a subungual hematoma. She was given a splint she is up to date on tetanus immunizations she was covered  with cephalexin 503 times a day for 5 days because this is an open fracture.

## 2014-05-19 NOTE — Patient Instructions (Signed)

## 2014-05-23 ENCOUNTER — Ambulatory Visit (INDEPENDENT_AMBULATORY_CARE_PROVIDER_SITE_OTHER): Payer: 59 | Admitting: Emergency Medicine

## 2014-05-23 VITALS — BP 108/72 | HR 78 | Temp 97.8°F | Resp 16 | Ht 63.0 in | Wt 188.0 lb

## 2014-05-23 DIAGNOSIS — W231XXD Caught, crushed, jammed, or pinched between stationary objects, subsequent encounter: Secondary | ICD-10-CM

## 2014-05-23 DIAGNOSIS — S61209D Unspecified open wound of unspecified finger without damage to nail, subsequent encounter: Secondary | ICD-10-CM

## 2014-05-23 DIAGNOSIS — S61203D Unspecified open wound of left middle finger without damage to nail, subsequent encounter: Secondary | ICD-10-CM

## 2014-05-23 NOTE — Progress Notes (Signed)
   Subjective:   This chart was scribed for Megan Russian, MD, by Delphia Grates, ED Scribe. This patient was seen in room Room/bed 13 and the patient's care was started at 1:18 PM.   Patient ID: Megan Burch, female    DOB: [birth year], 43 y.o.   MRN: [id number]  HPI   HPI Comments: Megan Burch is a 44 y.o. female who presents to the [facility] for a recheck of her left middle. Patient was seen here 4 days ago after she slammed her finger in a car door, 1 week ago, resulting in a tuft fracture. There is associated dull pain that radiates up the left arm, which is worse at night. She notes the finger remains swollen and appears crooked when she extends all fingers of her left hand. Patient suspects the wound is not draining properly and that she may have another fracture further down the finger. Patient is compliant with ABX (Keflex) and states she continues to soak the finger. She denies any pain at this time. Patient is right hand dominant.  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   Review of Systems  Constitutional: Negative for fever and chills.  Musculoskeletal: Positive for myalgias.  Skin: Positive for wound.       Objective:   Physical Exam  CONSTITUTIONAL: Well developed/well nourished HEAD: Normocephalic/atraumatic EYES: EOMI/PERRL ENMT: Mucous membranes moist NECK: supple no meningeal signs SPINE/BACK:entire spine nontender CV: S1/S2 noted, no murmurs/rubs/gallops noted LUNGS: Lungs are clear to auscultation bilaterally, no apparent distress ABDOMEN: soft, nontender, no rebound or guarding, bowel sounds noted throughout abdomen GU:no cva tenderness NEURO: Pt is awake/alert/appropriate, moves all extremitiesx4.  No facial droop.   EXTREMITIES: pulses normal/equal, full ROM.  Left middle finger: It appears the corner of the base of the nail has been  pulled way from the   soft tissue. This area is exposed. SKIN: warm, color normal PSYCH: no abnormalities of mood noted, alert and oriented to situation       Assessment & Plan:   Patient does have a tuft fracture. The base of the nail plate is exposed and takes up approximately one third of the base. A digital block will be preformed and this portion of the nail plate will be removed. Patient will continue her antibiotics.I personally performed the services described in this documentation, which was scribed in my presence. The recorded information has been reviewed and is accurate.

## 2014-05-23 NOTE — Progress Notes (Signed)
Verbal consent obtained from the patient. Anesthesia with Metacarpal Block using 4 cc 2% xylocaine plain. Betadine prep. Proximal nail that is no longer adhered to the nail bed is trimmed using nail splitter. Blood evacuated from the cavity. Cleansed and dressed.

## 2014-05-26 LAB — WOUND CULTURE
GRAM STAIN: NONE SEEN
Gram Stain: NONE SEEN
Gram Stain: NONE SEEN

## 2014-12-25 ENCOUNTER — Encounter: Payer: Self-pay | Admitting: Urgent Care

## 2014-12-25 ENCOUNTER — Ambulatory Visit (INDEPENDENT_AMBULATORY_CARE_PROVIDER_SITE_OTHER): Payer: 59

## 2014-12-25 ENCOUNTER — Ambulatory Visit (INDEPENDENT_AMBULATORY_CARE_PROVIDER_SITE_OTHER): Payer: 59 | Admitting: Family Medicine

## 2014-12-25 VITALS — BP 139/86 | HR 81 | Temp 98.6°F | Resp 16 | Wt 194.0 lb

## 2014-12-25 DIAGNOSIS — M25551 Pain in right hip: Secondary | ICD-10-CM | POA: Diagnosis not present

## 2014-12-25 DIAGNOSIS — E669 Obesity, unspecified: Secondary | ICD-10-CM

## 2014-12-25 DIAGNOSIS — Z90710 Acquired absence of both cervix and uterus: Secondary | ICD-10-CM | POA: Diagnosis not present

## 2014-12-25 DIAGNOSIS — R1084 Generalized abdominal pain: Secondary | ICD-10-CM

## 2014-12-25 DIAGNOSIS — K5900 Constipation, unspecified: Secondary | ICD-10-CM

## 2014-12-25 LAB — COMPREHENSIVE METABOLIC PANEL
ALK PHOS: 82 U/L (ref 33–115)
ALT: 11 U/L (ref 6–29)
AST: 17 U/L (ref 10–35)
Albumin: 4.2 g/dL (ref 3.6–5.1)
BILIRUBIN TOTAL: 0.6 mg/dL (ref 0.2–1.2)
BUN: 11 mg/dL (ref 7–25)
CO2: 30 mmol/L (ref 20–31)
Calcium: 9.4 mg/dL (ref 8.6–10.2)
Chloride: 102 mmol/L (ref 98–110)
Creat: 0.71 mg/dL (ref 0.50–1.10)
GLUCOSE: 85 mg/dL (ref 65–99)
Potassium: 4.1 mmol/L (ref 3.5–5.3)
Sodium: 141 mmol/L (ref 135–146)
TOTAL PROTEIN: 7.1 g/dL (ref 6.1–8.1)

## 2014-12-25 LAB — CBC WITH DIFFERENTIAL/PLATELET
Basophils Absolute: 0.1 10*3/uL (ref 0.0–0.1)
Basophils Relative: 1 % (ref 0–1)
EOS PCT: 3 % (ref 0–5)
Eosinophils Absolute: 0.3 10*3/uL (ref 0.0–0.7)
HEMATOCRIT: 41.9 % (ref 36.0–46.0)
Hemoglobin: 14.2 g/dL (ref 12.0–15.0)
LYMPHS ABS: 3.1 10*3/uL (ref 0.7–4.0)
LYMPHS PCT: 36 % (ref 12–46)
MCH: 27.5 pg (ref 26.0–34.0)
MCHC: 33.9 g/dL (ref 30.0–36.0)
MCV: 81 fL (ref 78.0–100.0)
MONO ABS: 0.8 10*3/uL (ref 0.1–1.0)
MONOS PCT: 9 % (ref 3–12)
MPV: 9.3 fL (ref 8.6–12.4)
Neutro Abs: 4.4 10*3/uL (ref 1.7–7.7)
Neutrophils Relative %: 51 % (ref 43–77)
Platelets: 364 10*3/uL (ref 150–400)
RBC: 5.17 MIL/uL — ABNORMAL HIGH (ref 3.87–5.11)
RDW: 14.6 % (ref 11.5–15.5)
WBC: 8.7 10*3/uL (ref 4.0–10.5)

## 2014-12-25 MED ORDER — POLYETHYLENE GLYCOL 3350 17 GM/SCOOP PO POWD: 17.0000 g | Freq: Every day | ORAL | Status: DC | PRN

## 2014-12-25 MED ORDER — DOCUSATE SODIUM 50 MG PO CAPS
50.0000 mg | ORAL_CAPSULE | Freq: Two times a day (BID) | ORAL | Status: DC
Start: 2014-12-25 — End: 2016-01-27

## 2014-12-25 NOTE — Patient Instructions (Addendum)
To help reduce constipation and promote bowel health: 1. Drink at least 64 ounces of water each day 2. Eat plenty of fiber (fruits, vegetables, whole grains, legumes) 3. Be physically active or exercise including walking, jogging, swimming, yoga, etc. 4. For active constipation use a stool softener (docusate) or an osmotic laxative (like Miralax) each day, or as needed  Please pick up Miralax for moderate to severe constipation. Take this once a day for the next 2-3 days. Please also start docusate stool softener, twice a day for at least 1 week. If stools become loose, cut down to once a day or  a 1/2 tablet twice a day for ~1 week. If stools remain loose, cut back to 1/2 pill for ~1 week. You can stop docusate thereafter and resume as needed for constipation.  Please ask the pharmacist for the recommendations on a fiber supplement.  Constipation Constipation is when a person has fewer than three bowel movements a week, has difficulty having a bowel movement, or has stools that are dry, hard, or larger than normal. As people grow older, constipation is more common. If you try to fix constipation with medicines that make you have a bowel movement (laxatives), the problem may get worse. Long-term laxative use may cause the muscles of the colon to become weak. A low-fiber diet, not taking in enough fluids, and taking certain medicines may make constipation worse.  CAUSES   Certain medicines, such as antidepressants, pain medicine, iron supplements, antacids, and water pills.   Certain diseases, such as diabetes, irritable bowel syndrome (IBS), thyroid disease, or depression.   Not drinking enough water.   Not eating enough fiber-rich foods.   Stress or travel.   Lack of physical activity or exercise.   Ignoring the urge to have a bowel movement.   Using laxatives too much.  SIGNS AND SYMPTOMS   Having fewer than three bowel movements a week.   Straining to have a bowel  movement.   Having stools that are hard, dry, or larger than normal.   Feeling full or bloated.   Pain in the lower abdomen.   Not feeling relief after having a bowel movement.  DIAGNOSIS  Your health care provider will take a medical history and perform a physical exam. Further testing may be done for severe constipation. Some tests may include:  A barium enema X-ray to examine your rectum, colon, and, sometimes, your small intestine.   A sigmoidoscopy to examine your lower colon.   A colonoscopy to examine your entire colon. TREATMENT  Treatment will depend on the severity of your constipation and what is causing it. Some dietary treatments include drinking more fluids and eating more fiber-rich foods. Lifestyle treatments may include regular exercise. If these diet and lifestyle recommendations do not help, your health care provider may recommend taking over-the-counter laxative medicines to help you have bowel movements. Prescription medicines may be prescribed if over-the-counter medicines do not work.  HOME CARE INSTRUCTIONS   Eat foods that have a lot of fiber, such as fruits, vegetables, whole grains, and beans.  Limit foods high in fat and processed sugars, such as french fries, hamburgers, cookies, candies, and soda.   A fiber supplement may be added to your diet if you cannot get enough fiber from foods.   Drink enough fluids to keep your urine clear or pale yellow.   Exercise regularly or as directed by your health care provider.   Go to the restroom when you have the urge to  go. Do not hold it.   Only take over-the-counter or prescription medicines as directed by your health care provider. Do not take other medicines for constipation without talking to your health care provider first.  [address] IF:   You have bright red blood in your stool.   Your constipation lasts for more than 4 days or gets worse.   You have abdominal or  rectal pain.   You have thin, pencil-like stools.   You have unexplained weight loss. MAKE SURE YOU:   Understand these instructions.  Will watch your condition.  Will get help right away if you are not doing well or get worse. Document Released: 01/01/2004 Document Revised: 04/09/2013 Document Reviewed: 01/14/2013 [facility] Patient Information 2015 [place], [place]. This information is not intended to replace advice given to you by your health care provider. Make sure you discuss any questions you have with your health care provider.

## 2014-12-25 NOTE — Progress Notes (Signed)
MRN: [id number] DOB: November 14, 1970  Subjective:   Megan Burch is a 44 y.o. female presenting for chief complaint of Nausea; darker stools; and right sided pain  Reports five-day history of epigastric pain with intermittent nausea. Pain feels like a gurgling sensation in upset stomach, occur sporadically and is transient lasting a few seconds. Patient has also had small amounts of soft dark stools, has about one bowel movement per week and has been this way as long she can remember. She has tried MiraLax intermittently with no significant change in her bowel movements. Denies fever, vomiting, bloody stools, straining, hard stools, perianal pain, dysuria, hematuria, urinary frequency. Patient admits to really unhealthy diet, no exercise, has a sedentary job. She drinks 8-12 ounces of water per day, also has coffee but denies sodas or drinking juice. She does admit to polydipsia. She denies a family history of cancer, diabetes in first degree relative. Of note patient also reports that she has had some right lower quadrant/hip pain for the past 3-4 months. Patient admits that it's aching in nature and has progressively worsened. She denies radiation of her pain. She denies a history of arthritis or hip surgery, no back surgery. Pain typically occurs in the morning after she wakes up and wanes throughout the day. Denies smoking cigarettes, 1 alcoholic beverage per month. Denies any other aggravating or relieving factors, no other questions or concerns.  Megan Burch has a current medication list which includes the following prescription(s): cephalexin, cetirizine, cyanocobalamin, fish oil-omega-3 fatty acids, and guaifenesin. Also has No Known Allergies.  Megan Burch  has a past medical history of No pertinent past medical history; Blood transfusion (07/13/11); Anemia; and Anxiety. Also  has past surgical history that includes Cesarean section; Diagnostic laparoscopy; and Abdominal hysterectomy.  Objective:    Vitals: BP 139/86 mmHg  Pulse 81  Temp(Src) 98.6 F (37 C) (Oral)  Resp 16  Wt 194 lb (87.998 kg)  LMP 06/28/2011  Physical Exam  Constitutional: She is oriented to person, place, and time. She appears well-developed and well-nourished.  Cardiovascular: Normal rate, regular rhythm and intact distal pulses.  Exam reveals no gallop and no friction rub.   No murmur heard. Pulmonary/Chest: No respiratory distress. She has no wheezes. She has no rales.  Abdominal: Soft. Bowel sounds are normal. She exhibits no distension and no mass. There is no tenderness.  No CVA tenderness.  Musculoskeletal: She exhibits no edema.  Neurological: She is alert and oriented to person, place, and time.  Skin: Skin is warm and dry. No rash noted. No erythema. No pallor.  Psychiatric: She has a normal mood and affect.   [facility] reading (PRIMARY) by  Dr. Brigitte Pulse and PA-Keyarah Mcroy. KUB - increased (moderate) stool burden. Right hip - normal.  Assessment and Plan :   1. Constipation, unspecified constipation type 2. Generalized abdominal pain - Labs pending. - Counseled patient on her generalized abdominal pain likely due to constipation, lack of exercise and diet. Recommended patient increase fiber intake, drink at least 64 ounces of water daily. Start docusate twice a day with MiraLax daily as needed. - POC Hemoccult Bld/Stl (3-Cd Home Screen); Future - Comprehensive metabolic panel - CBC with Differential/Platelet  3. Obesity - Labs pending. Recommended dietary changes and exercise as above. - Hemoglobin A1c  4. Right hip pain 5. History of hysterectomy - I recommended patient set up appointment with her gynecologist. Patient had a hysterectomy without salpingo-oophorectomy. Recommended that it would be appropriate to rule out a gynecologic cause for  her right hip pain. Patient agreed and said she would set up an appointment tomorrow.  Jaynee Eagles, PA-C [facility] [phone] 12/25/2014 1:07 PM

## 2014-12-26 LAB — HEMOGLOBIN A1C
HEMOGLOBIN A1C: 5.5 % (ref <5.7)
Mean Plasma Glucose: 111 mg/dL (ref <117)

## 2014-12-31 ENCOUNTER — Telehealth: Payer: Self-pay | Admitting: Family Medicine

## 2014-12-31 NOTE — Telephone Encounter (Signed)
Test was ordered in epic as hemoccult cards patient brought back a hemosure tube which was negative could you please Cancell the other test

## 2014-12-31 NOTE — Telephone Encounter (Signed)
Thank you, can you please let patient know? This means that she likely does not have any blood in her colon. If she hasn't had any improvement, we can check for hemoccult although this is unlikely to be positive given that she really doesn't have anemia. I would recommend we continue with our treatment plan for constipation. Thank you!

## 2015-01-01 NOTE — Telephone Encounter (Signed)
Spoke with patient she's doing better and gave her result of hemosure

## 2015-01-13 NOTE — Progress Notes (Signed)
Patient ID: Megan Burch, female   DOB: [birth year], 44 y.o.   MRN: [id number] Reviewed documentation and xray and agree w/ assessment and plan. Delman Cheadle, MD MPH

## 2015-01-15 ENCOUNTER — Ambulatory Visit: Payer: Self-pay | Admitting: Urgent Care

## 2015-01-29 ENCOUNTER — Ambulatory Visit: Payer: 59 | Admitting: Urgent Care

## 2015-12-15 ENCOUNTER — Emergency Department (HOSPITAL_BASED_OUTPATIENT_CLINIC_OR_DEPARTMENT_OTHER)
Admission: EM | Admit: 2015-12-15 | Discharge: 2015-12-15 | Disposition: A | Discharge status: Home / Self Care | Payer: 59 | Attending: Emergency Medicine | Admitting: Emergency Medicine

## 2015-12-15 ENCOUNTER — Encounter (HOSPITAL_BASED_OUTPATIENT_CLINIC_OR_DEPARTMENT_OTHER): Payer: Self-pay | Admitting: *Deleted

## 2015-12-15 DIAGNOSIS — M549 Dorsalgia, unspecified: Secondary | ICD-10-CM | POA: Diagnosis not present

## 2015-12-15 DIAGNOSIS — Z79899 Other long term (current) drug therapy: Secondary | ICD-10-CM | POA: Diagnosis not present

## 2015-12-15 DIAGNOSIS — R1084 Generalized abdominal pain: Secondary | ICD-10-CM

## 2015-12-15 DIAGNOSIS — R112 Nausea with vomiting, unspecified: Secondary | ICD-10-CM | POA: Insufficient documentation

## 2015-12-15 DIAGNOSIS — R109 Unspecified abdominal pain: Secondary | ICD-10-CM | POA: Diagnosis present

## 2015-12-15 LAB — CBC WITH DIFFERENTIAL/PLATELET
BASOS ABS: 0 10*3/uL (ref 0.0–0.1)
BASOS PCT: 0 %
EOS PCT: 1 %
Eosinophils Absolute: 0.1 10*3/uL (ref 0.0–0.7)
HEMATOCRIT: 33.3 % — AB (ref 36.0–46.0)
HEMOGLOBIN: 12.3 g/dL (ref 12.0–15.0)
LYMPHS ABS: 2 10*3/uL (ref 0.7–4.0)
Lymphocytes Relative: 16 %
MCH: 27.8 pg (ref 26.0–34.0)
MCHC: 36.9 g/dL — AB (ref 30.0–36.0)
MCV: 75.2 fL — AB (ref 78.0–100.0)
MONOS PCT: 6 %
Monocytes Absolute: 0.8 10*3/uL (ref 0.1–1.0)
NEUTROS ABS: 9.8 10*3/uL — AB (ref 1.7–7.7)
Neutrophils Relative %: 77 %
Platelets: 359 10*3/uL (ref 150–400)
RBC: 4.43 MIL/uL (ref 3.87–5.11)
RDW: 14.8 % (ref 11.5–15.5)
WBC: 12.7 10*3/uL — ABNORMAL HIGH (ref 4.0–10.5)

## 2015-12-15 LAB — COMPREHENSIVE METABOLIC PANEL
ALBUMIN: 4 g/dL (ref 3.5–5.0)
ALT: 16 U/L (ref 14–54)
AST: 21 U/L (ref 15–41)
Alkaline Phosphatase: 82 U/L (ref 38–126)
Anion gap: 5 (ref 5–15)
BUN: 12 mg/dL (ref 6–20)
CHLORIDE: 104 mmol/L (ref 101–111)
CO2: 29 mmol/L (ref 22–32)
Calcium: 8.9 mg/dL (ref 8.9–10.3)
Creatinine, Ser: 0.66 mg/dL (ref 0.44–1.00)
GFR calc Af Amer: >60 mL/min (ref >60)
GFR calc non Af Amer: >60 mL/min (ref >60)
GLUCOSE: 115 mg/dL — AB (ref 65–99)
POTASSIUM: 3.3 mmol/L — AB (ref 3.5–5.1)
Sodium: 138 mmol/L (ref 135–145)
Total Bilirubin: 0.3 mg/dL (ref 0.3–1.2)
Total Protein: 7.7 g/dL (ref 6.5–8.1)

## 2015-12-15 LAB — URINALYSIS, ROUTINE W REFLEX MICROSCOPIC
Bilirubin Urine: NEGATIVE
Glucose, UA: NEGATIVE mg/dL
HGB URINE DIPSTICK: NEGATIVE
Ketones, ur: NEGATIVE mg/dL
Leukocytes, UA: NEGATIVE
NITRITE: NEGATIVE
PROTEIN: NEGATIVE mg/dL
Specific Gravity, Urine: 1.02 (ref 1.005–1.030)
pH: 7 (ref 5.0–8.0)

## 2015-12-15 LAB — LIPASE, BLOOD: Lipase: 23 U/L (ref 11–51)

## 2015-12-15 MED ORDER — SODIUM CHLORIDE 0.9 % IV BOLUS (SEPSIS): 1000.0000 mL | Freq: Once | INTRAVENOUS | Status: DC

## 2015-12-15 MED ORDER — KETOROLAC TROMETHAMINE 30 MG/ML IJ SOLN
30.0000 mg | Freq: Once | INTRAMUSCULAR | Status: AC
  Administered 2015-12-15: 30 mg via INTRAVENOUS
  Filled 2015-12-15: qty 1

## 2015-12-15 MED ORDER — ONDANSETRON 4 MG PO TBDP
4.0000 mg | ORAL_TABLET | Freq: Three times a day (TID) | ORAL | 0 refills | Status: DC | PRN
Start: 2015-12-15 — End: 2016-01-27

## 2015-12-15 MED ORDER — ONDANSETRON HCL 4 MG/2ML IJ SOLN
4.0000 mg | INTRAMUSCULAR | Status: DC | PRN
  Administered 2015-12-15: 4 mg via INTRAVENOUS
  Filled 2015-12-15: qty 2

## 2015-12-15 NOTE — ED Triage Notes (Addendum)
Abdominal and back pressure into her right flank x 2 hours. Vomiting.

## 2015-12-15 NOTE — ED Provider Notes (Signed)
[facility] [facility] DEPT [facility] Provider Note   CSN: [id number] Arrival date & time: 12/15/15  1951 By signing my name below, I, Megan Burch, attest that this documentation has been prepared under the direction and in the presence of Megan Grosser, MD. Electronically Signed: Georgette Burch, ED Scribe. 12/15/15. 9:04 PM.  History   Chief Complaint Chief Complaint  Patient presents with  . Abdominal Pain  . Back Pain   HPI Comments: Megan Burch is a 45 y.o. female who presents to the Emergency Department complaining of sudden onset, constant, 8/10 abdominal pain radiating to her back and right flank onset 2 hours ago. Pt also has associated nausea and vomiting. Pt states she was sitting at onset. Pt notes that pain is exacerbated with palpation. Pt reports normal bowel movement. No alleviating factors noted. Per pt, her daughter had a virus and she thinks this might be the same. Denies fever, dysuria, urinary frequency, or any other associated symptoms.   The history is provided by the patient. No language interpreter was used.   Past Medical History:  Diagnosis Date  . Anemia    Had blood transfussion APril 2013  . Anxiety   . Blood transfusion 07/13/11   [place]  . No pertinent past medical history     Patient Active Problem List   Diagnosis Date Noted  . CN (constipation) 12/25/2014  . Generalized abdominal pain 12/25/2014  . Obesity 12/25/2014  . Right hip pain 12/25/2014    Past Surgical History:  Procedure Laterality Date  . ABDOMINAL HYSTERECTOMY    . CESAREAN SECTION    . DIAGNOSTIC LAPAROSCOPY      OB History    Gravida Para Term Preterm AB Living   3 3       3    SAB TAB Ectopic Multiple Live Births                   Home Medications    Prior to Admission medications   Medication Sig Start Date End Date Taking? Authorizing Provider  cetirizine (ZYRTEC) 10 MG tablet Take 10 mg by mouth 2 (two) times daily as needed. For allergies   Yes Historical Provider, MD    Cyanocobalamin (VITAMIN B12 PO) Take 1 tablet by mouth every morning.   Yes Historical Provider, MD  fish oil-omega-3 fatty acids 1000 MG capsule Take 1 g by mouth every morning.   Yes Historical Provider, MD  docusate sodium (COLACE) 50 MG capsule Take 1 capsule (50 mg total) by mouth 2 (two) times daily. 12/25/14   Jaynee Eagles, PA-C  Guaifenesin [facility] MAXIMUM STRENGTH) 1200 MG TB12 Take 1 tablet (1,200 mg total) by mouth every 12 (twelve) hours as needed. Patient not taking: Reported on 12/25/2014 08/13/13   Harrison Mons, PA-C  polyethylene glycol powder (GLYCOLAX/MIRALAX) powder Take 17 g by mouth daily as needed. 12/25/14   Jaynee Eagles, PA-C    Family History Family History  Problem Relation Age of Onset  . Diabetes Maternal Grandmother   . Hyperlipidemia Maternal Grandmother   . Stroke Paternal Grandmother   . Hyperlipidemia Paternal Grandmother   . Diabetes Paternal Grandmother   . Heart disease Paternal Grandmother     Social History Social History  Substance Use Topics  . Smoking status: Never Smoker  . Smokeless tobacco: Never Used  . Alcohol use No     Allergies   Review of patient's allergies indicates no known allergies.   Review of Systems Review of Systems  Constitutional: Negative for fever.  Gastrointestinal: Positive for abdominal pain, nausea and vomiting.  Genitourinary: Negative for dysuria and frequency.  Musculoskeletal: Positive for back pain.  All other systems reviewed and are negative.    Physical Exam Updated Vital Signs BP 127/91 (BP Location: Left Arm)   Pulse 96   Temp 97.8 F (36.6 C) (Oral)   Resp 18   Ht 5\' 2"  (1.575 m)   Wt 186 lb (84.4 kg)   LMP 06/28/2011   SpO2 100%   BMI 34.02 kg/m   Physical Exam  Constitutional: She appears well-developed and well-nourished.  HENT:  Head: Normocephalic.  Eyes: Conjunctivae are normal.  Cardiovascular: Normal rate.   Pulmonary/Chest: Effort normal. No respiratory distress.  Abdominal:  Soft. Bowel sounds are normal. She exhibits no distension. There is no tenderness. There is no rebound, no guarding and no CVA tenderness.  Musculoskeletal: Normal range of motion.  Neurological: She is alert.  Skin: Skin is warm and dry.  Psychiatric: She has a normal mood and affect. Her behavior is normal.  Nursing note and vitals reviewed.  ED Treatments / Results  DIAGNOSTIC STUDIES: Oxygen Saturation is 100% on RA, normal by my interpretation.    COORDINATION OF CARE: 9:04 PM Discussed treatment plan with pt at bedside which includes IVF and lab work and pt agreed to plan.  Labs (all labs ordered are listed, but only abnormal results are displayed) Labs Reviewed  COMPREHENSIVE METABOLIC PANEL - Abnormal; Notable for the following:       Result Value   Potassium 3.3 (*)    Glucose, Bld 115 (*)    All other components within normal limits  CBC WITH DIFFERENTIAL/PLATELET - Abnormal; Notable for the following:    WBC 12.7 (*)    HCT 33.3 (*)    MCV 75.2 (*)    MCHC 36.9 (*)    Neutro Abs 9.8 (*)    All other components within normal limits  URINE CULTURE  URINALYSIS, ROUTINE W REFLEX MICROSCOPIC (NOT AT [facility])  LIPASE, BLOOD    EKG  EKG Interpretation None       Radiology No results found.  Procedures Procedures (including critical care time)  Emergency Focused Ultrasound Exam Limited Retroperitoneal Ultrasound of Kidneys  Performed and interpreted by Dr. Laneta Simmers Focused abdominal ultrasound with both kidneys imaged in transverse and longitudinal planes in real-time. Indication: flank pain Findings: bilateral kidneys present, no shadowing, no anechoic areas Interpretation: no hydronephrosis visualized.  no stones or cysts visualized  Images archived electronically  CPT Code: [phone]   Medications Ordered in ED Medications  ondansetron [facility]) injection 4 mg (4 mg Intravenous Given 12/15/15 2118)  sodium chloride 0.9 % bolus 1,000 mL (not administered)    ketorolac (TORADOL) 30 MG/ML injection 30 mg (30 mg Intravenous Given 12/15/15 2118)     Initial Impression / Assessment and Plan / ED Course  I have reviewed the triage vital signs and the nursing notes.  Pertinent labs & imaging results that were available during my care of the patient were reviewed by me and considered in my medical decision making (see chart for details).  Clinical Course    45 y.o. female presents with sudden onset diffuse abdominal pain which started tonight just PTA radiating into right flank. D/t sudden onset considered kidney stone but no evidence of obstrucution and no blood in urine. Daughter had recent gastroenteritis. Abdominal exam reassuring. Labs unremarkable except mild leukocytosis. Symptoms improved with anti-inflammatories and supportive care measures. Possible early gastroenteritis. No indication for emergent imaging  currently. Plan to follow up with PCP as needed and return precautions discussed for worsening or new concerning symptoms.   Final Clinical Impressions(s) / ED Diagnoses   Final diagnoses:  Generalized abdominal pain    New Prescriptions Discharge Medication List as of 12/15/2015 10:40 PM    I personally performed the services described in this documentation, which was scribed in my presence. The recorded information has been reviewed and is accurate.        Megan Grosser, MD 12/16/15 [phone]

## 2015-12-17 LAB — URINE CULTURE

## 2016-01-27 ENCOUNTER — Ambulatory Visit (INDEPENDENT_AMBULATORY_CARE_PROVIDER_SITE_OTHER): Payer: 59 | Admitting: Family Medicine

## 2016-01-27 VITALS — BP 122/74 | HR 95 | Temp 98.1°F | Resp 18 | Ht 62.0 in | Wt 185.0 lb

## 2016-01-27 DIAGNOSIS — R21 Rash and other nonspecific skin eruption: Secondary | ICD-10-CM

## 2016-01-27 MED ORDER — DOXYCYCLINE HYCLATE 100 MG PO CAPS: 100.0000 mg | ORAL_CAPSULE | Freq: Two times a day (BID) | ORAL | 0 refills | Status: AC

## 2016-01-27 NOTE — Progress Notes (Signed)
Patient ID: Megan Burch, female    DOB: Nov 25, 1970, 45 y.o.   MRN: [id number]  PCP: No primary care provider on file.  Chief Complaint  Patient presents with  . Insect Bite    left wrist, woke up with the bite on Monday, concern of possible spider bite     Subjective:   HPI Presents for evaluation of left wrist insect bite.  45 year old, female, reports waking up and noticed three lesion on her inner left wrist times 3 days ago. She denies being aware of contact with an insect. Over the last few days the lesions on her wrist has increased size, have pustular heads, and itch. She has only applied alcohol to the lesions. Denies fever or any other lesions elsewhere on the body.   Social History   Social History  . Marital status: Married    Spouse name: N/A  . Number of children: N/A  . Years of education: N/A   Occupational History  . Not on file.   Social History Main Topics  . Smoking status: Never Smoker  . Smokeless tobacco: Never Used  . Alcohol use No  . Drug use: No  . Sexual activity: Yes    Birth control/ protection: None   Other Topics Concern  . Not on file   Social History Narrative   Lives with husband and 3 children   Family History  Problem Relation Age of Onset  . Diabetes Maternal Grandmother   . Hyperlipidemia Maternal Grandmother   . Stroke Paternal Grandmother   . Hyperlipidemia Paternal Grandmother   . Diabetes Paternal Grandmother   . Heart disease Paternal Grandmother     Review of Systems See HPI  Patient Active Problem List   Diagnosis Date Noted  . CN (constipation) 12/25/2014  . Generalized abdominal pain 12/25/2014  . Obesity 12/25/2014  . Right hip pain 12/25/2014     Prior to Admission medications   Medication Sig Start Date End Date Taking? Authorizing Provider  cetirizine (ZYRTEC) 10 MG tablet Take 10 mg by mouth 2 (two) times daily as needed. For allergies   Yes Historical Provider, MD  Cyanocobalamin (VITAMIN B12  PO) Take 1 tablet by mouth every morning.   Yes Historical Provider, MD  fish oil-omega-3 fatty acids 1000 MG capsule Take 1 g by mouth every morning.   Yes Historical Provider, MD     No Known Allergies     Objective:  Physical Exam  Constitutional: She is oriented to person, place, and time. She appears well-developed and well-nourished.  HENT:  Head: Normocephalic and atraumatic.  Right Ear: External ear normal.  Left Ear: External ear normal.  Nose: Nose normal.  Eyes: EOM are normal. Pupils are equal, round, and reactive to light.  Neck: Neck supple.  Pulmonary/Chest: Effort normal.  Musculoskeletal: Normal range of motion.  Neurological: She is alert and oriented to person, place, and time.  Skin: Skin is warm. Lesion and rash noted. Rash is pustular. There is erythema.     Psychiatric: She has a normal mood and affect. Her behavior is normal. Judgment and thought content normal.    Vitals:   01/27/16 1344  BP: 122/74  Pulse: 95  Resp: 18  Temp: 98.1 F (36.7 C)   Assessment & Plan:  1. Insect bite, initial encounter, unknown type of insect. Possible spider or ant bite. Three lesions are both vesicular, with a erythremic base. No evidence of erythremic migration of surrounding tissue.  Plan: Treat  prophylactically with Doxycyline 100 mg twice daily to prevent cellulitis and MRSA. Return for care if redness expands beyond the three lesions on the left wrist or if lesions become enlarge, warm, or begin to drain pus.  Carroll Sage. Kenton Kingfisher, MSN, FNP-C [facility]

## 2016-01-27 NOTE — Patient Instructions (Addendum)
Take Doxycyline 100 mg twice daily for 10 days.  Return for care if you notice increasing redness or swelling of the left wrist.    IF you received an x-ray today, you will receive an invoice from [facility]. Please contact [facility] at [phone] with questions or concerns regarding your invoice.   IF you received labwork today, you will receive an invoice from [employer]. Please contact [facility] at [phone] with questions or concerns regarding your invoice.   Our billing staff will not be able to assist you with questions regarding bills from these companies.  You will be contacted with the lab results as soon as they are available. The fastest way to get your results is to activate your My Chart account. Instructions are located on the last page of this paperwork. If you have not heard from [place] regarding the results in 2 weeks, please contact this office.     Bee, Wasp, or [employer], wasps, and hornets are part of a family of insects that can sting people. These stings can cause pain and inflammation, but they are usually not serious. However, some people may have an allergic reaction to a sting. This can cause the symptoms to be more severe.  SYMPTOMS  Common symptoms of this condition include:   A red lump in the skin that sometimes has a tiny hole in the center. In some cases, a stinger may be in the center of the wound.  Pain and itching at the sting site.  Redness and swelling around the sting site. If you have an allergic reaction (localized allergic reaction), the swelling and redness may spread out from the sting site. In some cases, this reaction can continue to develop over the next 12-36 hours. In rare cases, a person may have a severe allergic reaction (anaphylactic reaction) to a sting. Symptoms of an anaphylactic reaction may include:   Wheezing or difficulty breathing.  Raised, itchy, red patches on the  skin.  Nausea or vomiting.  Abdominal cramping.  Diarrhea.  Chest pain.  Fainting.  Redness of the face (flushing). DIAGNOSIS  This condition is usually diagnosed based on symptoms, medical history, and a physical exam. TREATMENT  Most stings can be treated with:   Icing to reduce swelling.  Medicines (antihistamines) to treat itching or an allergic reaction.  Medicines to help reduce pain. These may be medicines that you take by mouth, or medicated creams or lotions that you apply to your skin. If you were stung by a bee, the stinger and a small sac of poison may be in the wound. This may be removed by brushing across it with a flat card, such as a credit card. Another method is to pinch the area and pull it out. These methods can help reduce the severity of the body's reaction to the sting.  HOME CARE INSTRUCTIONS   Wash the sting site daily with soap and water as told by your health care provider.  Apply or take over-the-counter and prescription medicines only as told by your health care provider.  If directed, apply ice to the sting area.  Put ice in a plastic bag.  Place a towel between your skin and the bag.  Leave the ice on for 20 minutes, 2-3 times per day.  Do not scratch the sting area.  To lessen pain, try using a paste that is made of water and baking soda. Rub the paste on the sting area and leave it on  for 5 minutes.  If you had a severe allergic reaction to a sting, you may need:  To wear a medical bracelet or necklace that lists the allergy.  To learn when and how to use an anaphylaxis kit or epinephrine injection. Your family members may also need to learn this.  To carry an anaphylaxis kit with you at all times. SEEK MEDICAL CARE IF:   Your symptoms do not get better in 2-3 days.  You have redness, swelling, or pain that spreads beyond the area of the sting.  You have a fever. SEEK IMMEDIATE MEDICAL CARE IF:  You have symptoms of a  severe allergic reaction. These include:   Wheezing or difficulty breathing.  Chest pain.  Light-headedness or fainting.  Itchy, raised, red patches on the skin.  Nausea or vomiting.  Abdominal cramping.  Diarrhea.   This information is not intended to replace advice given to you by your health care provider. Make sure you discuss any questions you have with your health care provider.   Document Released: 04/04/2005 Document Revised: 12/24/2014 Document Reviewed: 08/20/2014 Elsevier Interactive Patient Education [employer].

## 2016-01-29 ENCOUNTER — Ambulatory Visit (INDEPENDENT_AMBULATORY_CARE_PROVIDER_SITE_OTHER): Payer: 59 | Admitting: Family Medicine

## 2016-01-29 VITALS — BP 122/80 | HR 85 | Temp 98.2°F | Resp 16 | Ht 62.0 in | Wt 185.0 lb

## 2016-01-29 DIAGNOSIS — L03114 Cellulitis of left upper limb: Secondary | ICD-10-CM

## 2016-01-29 DIAGNOSIS — W57XXXD Bitten or stung by nonvenomous insect and other nonvenomous arthropods, subsequent encounter: Secondary | ICD-10-CM | POA: Diagnosis not present

## 2016-01-29 MED ORDER — MUPIROCIN 2 % EX OINT: 1.0000 "application " | TOPICAL_OINTMENT | Freq: Three times a day (TID) | CUTANEOUS | 1 refills | Status: AC

## 2016-01-29 MED ORDER — CEPHALEXIN 500 MG PO CAPS: 1000.0000 mg | ORAL_CAPSULE | Freq: Two times a day (BID) | ORAL | 0 refills | Status: AC

## 2016-01-29 NOTE — Patient Instructions (Addendum)
Continue Doxycyline  Start Keflex 1000 mg twice daily.  Apply Mupirocin ointment three times  daily for affected area.      IF you received an x-ray today, you will receive an invoice from [facility]. Please contact [facility] at [phone] with questions or concerns regarding your invoice.   IF you received labwork today, you will receive an invoice from [employer]. Please contact [facility] at [phone] with questions or concerns regarding your invoice.   Our billing staff will not be able to assist you with questions regarding bills from these companies.  You will be contacted with the lab results as soon as they are available. The fastest way to get your results is to activate your My Chart account. Instructions are located on the last page of this paperwork. If you have not heard from [place] regarding the results in 2 weeks, please contact this office.      Cellulitis Cellulitis is an infection of the skin and the tissue beneath it. The infected area is usually red and tender. Cellulitis occurs most often in the arms and lower legs.  CAUSES  Cellulitis is caused by bacteria that enter the skin through cracks or cuts in the skin. The most common types of bacteria that cause cellulitis are staphylococci and streptococci. SIGNS AND SYMPTOMS   Redness and warmth.  Swelling.  Tenderness or pain.  Fever. DIAGNOSIS  Your health care provider can usually determine what is wrong based on a physical exam. Blood tests may also be done. TREATMENT  Treatment usually involves taking an antibiotic medicine. HOME CARE INSTRUCTIONS   Take your antibiotic medicine as directed by your health care provider. Finish the antibiotic even if you start to feel better.  Keep the infected arm or leg elevated to reduce swelling.  Apply a warm cloth to the affected area up to 4 times per day to relieve pain.  Take medicines only as directed by your  health care provider.  Keep all follow-up visits as directed by your health care provider. SEEK MEDICAL CARE IF:   You notice red streaks coming from the infected area.  Your red area gets larger or turns dark in color.  Your bone or joint underneath the infected area becomes painful after the skin has healed.  Your infection returns in the same area or another area.  You notice a swollen bump in the infected area.  You develop new symptoms.  You have a fever. SEEK IMMEDIATE MEDICAL CARE IF:   You feel very sleepy.  You develop vomiting or diarrhea.  You have a general ill feeling (malaise) with muscle aches and pains.   This information is not intended to replace advice given to you by your health care provider. Make sure you discuss any questions you have with your health care provider.   Document Released: 01/12/2005 Document Revised: 12/24/2014 Document Reviewed: 06/20/2011 Elsevier Interactive Patient Education [employer].

## 2016-01-29 NOTE — Progress Notes (Signed)
Patient ID: Shuntavia Roopnarine Weller, female    DOB: [birth year], 45 y.o.   MRN: [id number]  PCP: Pcp Not In System  Chief Complaint  Patient presents with  . Follow-up    x 4 days, bug bite on left wrist    Subjective:   HPI Presents for evaluation of insect bite of the left wrist.  She originally was seen two days ago and was placed on Doxycycline 100 mg BID which she started on that day.  Today she presents with increased redness extending beyond the diameter of the original lesion. Reports increase pruritis, pustules are larger than previously although she denies any expression of exudate. Denies fever, reports full sensation and range of motion of left wrist. Social History   Social History  . Marital status: Married    Spouse name: N/A  . Number of children: N/A  . Years of education: N/A   Occupational History  . Not on file.   Social History Main Topics  . Smoking status: Never Smoker  . Smokeless tobacco: Never Used  . Alcohol use No  . Drug use: No  . Sexual activity: Yes    Birth control/ protection: None   Other Topics Concern  . Not on file   Social History Narrative   Lives with husband and 3 children   Family History  Problem Relation Age of Onset  . Diabetes Maternal Grandmother   . Hyperlipidemia Maternal Grandmother   . Stroke Paternal Grandmother   . Hyperlipidemia Paternal Grandmother   . Diabetes Paternal Grandmother   . Heart disease Paternal Grandmother     Review of Systems HPI  Patient Active Problem List   Diagnosis Date Noted  . CN (constipation) 12/25/2014  . Generalized abdominal pain 12/25/2014  . Obesity 12/25/2014  . Right hip pain 12/25/2014     Prior to Admission medications   Medication Sig Start Date End Date Taking? Authorizing Provider  cetirizine (ZYRTEC) 10 MG tablet Take 10 mg by mouth 2 (two) times daily as needed. For allergies   Yes Historical Provider, MD  Cyanocobalamin (VITAMIN B12 PO) Take 1 tablet by mouth every  morning.   Yes Historical Provider, MD  doxycycline (VIBRAMYCIN) 100 MG capsule Take 1 capsule (100 mg total) by mouth 2 (two) times daily. 01/27/16  Yes Sedalia Muta, FNP  fish oil-omega-3 fatty acids 1000 MG capsule Take 1 g by mouth every morning.   Yes Historical Provider, MD  venlafaxine (EFFEXOR) 37.5 MG tablet Take 37.5 mg by mouth 1 day or 1 dose.   Yes Historical Provider, MD   No Known Allergies     Objective:  Physical Exam  Constitutional: She is oriented to person, place, and time. She appears well-developed and well-nourished.  HENT:  Head: Normocephalic and atraumatic.  Right Ear: External ear normal.  Left Ear: External ear normal.  Nose: Nose normal.  Eyes: Conjunctivae and EOM are normal. Pupils are equal, round, and reactive to light.  Neck: Normal range of motion. Neck supple.  Cardiovascular: Normal rate.   Pulmonary/Chest: Effort normal.  Musculoskeletal: Normal range of motion.  Neurological: She is alert and oriented to person, place, and time.  Skin: Skin is warm and dry. Lesion and rash noted. Rash is pustular. There is erythema.     Psychiatric: She has a normal mood and affect. Her behavior is normal. Judgment and thought content normal.     Vitals:   01/29/16 1056  BP: 122/80  Pulse: 85  Resp: 16  Temp: 98.2 F (36.8 C)   Assessment & Plan:  1. Insect bite, subsequent encounter - WOUND CULTURE  2. Cellulitis of left upper extremity Plan: Cephalexin (Keflex) 500 mg, 2 capsule twice daily for 7 days. Mupirocin (bactroban) apply to lesions 3 times daily until lesions clear. Complete all Doxycycline 100 mg twice daily.  Return for follow-up on 02/01/16 if rash hasn't improved or worsened.  Carroll Sage. Kenton Kingfisher, MSN, FNP-C [facility]

## 2016-01-31 LAB — WOUND CULTURE
GRAM STAIN: NONE SEEN
Gram Stain: NONE SEEN
Gram Stain: NONE SEEN
ORGANISM ID, BACTERIA: NO GROWTH

## 2016-02-01 ENCOUNTER — Ambulatory Visit (INDEPENDENT_AMBULATORY_CARE_PROVIDER_SITE_OTHER): Payer: 59 | Admitting: Family Medicine

## 2016-02-01 ENCOUNTER — Encounter: Payer: Self-pay | Admitting: Family Medicine

## 2016-02-01 VITALS — BP 122/72 | HR 77 | Temp 97.9°F | Resp 17 | Ht 63.0 in | Wt 186.0 lb

## 2016-02-01 DIAGNOSIS — Z5189 Encounter for other specified aftercare: Secondary | ICD-10-CM

## 2016-02-01 NOTE — Patient Instructions (Signed)
     IF you received an x-ray today, you will receive an invoice from [facility]. Please contact [facility] at [phone] with questions or concerns regarding your invoice.   IF you received labwork today, you will receive an invoice from [facility]. Please contact [facility] at [phone] with questions or concerns regarding your invoice.   Our billing staff will not be able to assist you with questions regarding bills from these companies.  You will be contacted with the lab results as soon as they are available. The fastest way to get your results is to activate your My Chart account. Instructions are located on the last page of this paperwork. If you have not heard from us regarding the results in 2 weeks, please contact this office.      

## 2016-02-01 NOTE — Progress Notes (Signed)
Wound Recheck  Follow-up for recheck for insect bite on the left wrist.   Patient reports significant improvement of rash posterior of left wrist. She has continued antibiotic and topical. She denies any persistent itching or burning.   Erythema at the base of the rash completely resolved. Wound bed temperature equal to unaffected skin. Edema has resolve of the left wrist. 2 wounds still have small vesicular roof present. Overall, significant improvement.  Plan: Complete doxycycline and cephalexin. Continue to apply mupirocin ointment to lesions as needed. Follow-up as needed.  Carroll Sage. Kenton Kingfisher, MSN, FNP-C [facility]

## 2016-02-01 NOTE — Progress Notes (Deleted)
   Patient ID: Megan Burch, female    DOB: [birth year], 45 y.o.   MRN: [id number]  PCP: Pcp Not In System  Chief Complaint  Patient presents with  . Follow-up    insect bite     Subjective:   HPI 45 year old presents for evaluation of insect bite of left wrist     ***.    Review of Systems     Patient Active Problem List   Diagnosis Date Noted  . CN (constipation) 12/25/2014  . Generalized abdominal pain 12/25/2014  . Obesity 12/25/2014  . Right hip pain 12/25/2014     Prior to Admission medications   Medication Sig Start Date End Date Taking? Authorizing Provider  cephALEXin (KEFLEX) 500 MG capsule Take 2 capsules (1,000 mg total) by mouth 2 (two) times daily. 01/29/16  Yes Sedalia Muta, FNP  cetirizine (ZYRTEC) 10 MG tablet Take 10 mg by mouth 2 (two) times daily as needed. For allergies   Yes Historical Provider, MD  Cyanocobalamin (VITAMIN B12 PO) Take 1 tablet by mouth every morning.   Yes Historical Provider, MD  doxycycline (VIBRAMYCIN) 100 MG capsule Take 1 capsule (100 mg total) by mouth 2 (two) times daily. 01/27/16  Yes Sedalia Muta, FNP  fish oil-omega-3 fatty acids 1000 MG capsule Take 1 g by mouth every morning.   Yes Historical Provider, MD  mupirocin ointment (BACTROBAN) 2 % Apply 1 application topically 3 (three) times daily. 01/29/16  Yes Sedalia Muta, FNP  venlafaxine (EFFEXOR) 37.5 MG tablet Take 37.5 mg by mouth 1 day or 1 dose.   Yes Historical Provider, MD     No Known Allergies     Objective:  Physical Exam         Assessment & Plan:   ***

## 2016-04-20 DIAGNOSIS — Z1231 Encounter for screening mammogram for malignant neoplasm of breast: Secondary | ICD-10-CM | POA: Diagnosis not present

## 2016-04-20 DIAGNOSIS — Z01419 Encounter for gynecological examination (general) (routine) without abnormal findings: Secondary | ICD-10-CM | POA: Diagnosis not present

## 2016-06-16 DIAGNOSIS — H8110 Benign paroxysmal vertigo, unspecified ear: Secondary | ICD-10-CM | POA: Diagnosis not present

## 2016-06-16 DIAGNOSIS — R0981 Nasal congestion: Secondary | ICD-10-CM | POA: Diagnosis not present

## 2016-08-10 IMAGING — CR DG ABDOMEN 1V
2 series · 2 of 2 positions shown · non-contrast
Comparison: None.

CLINICAL DATA: Constipation

EXAM:
ABDOMEN - 1 VIEW

[AP (1 of 2)]
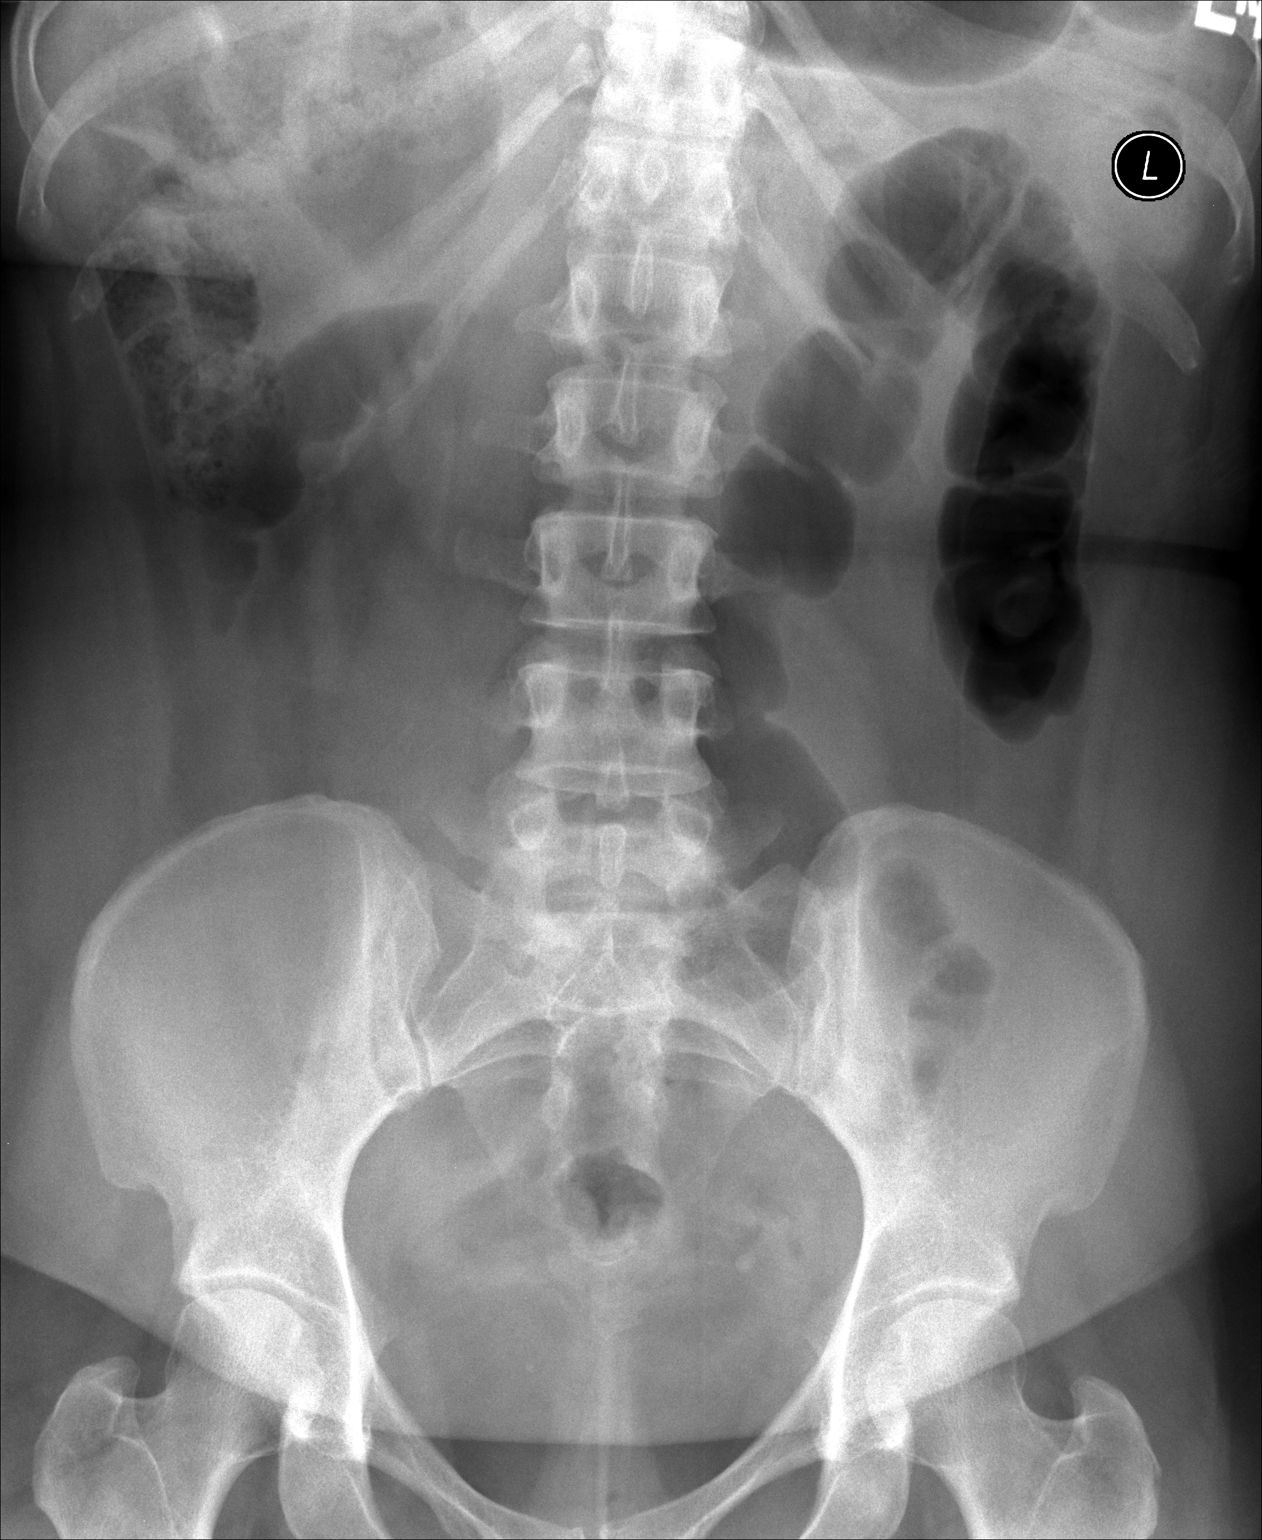

[AP (2 of 2)]
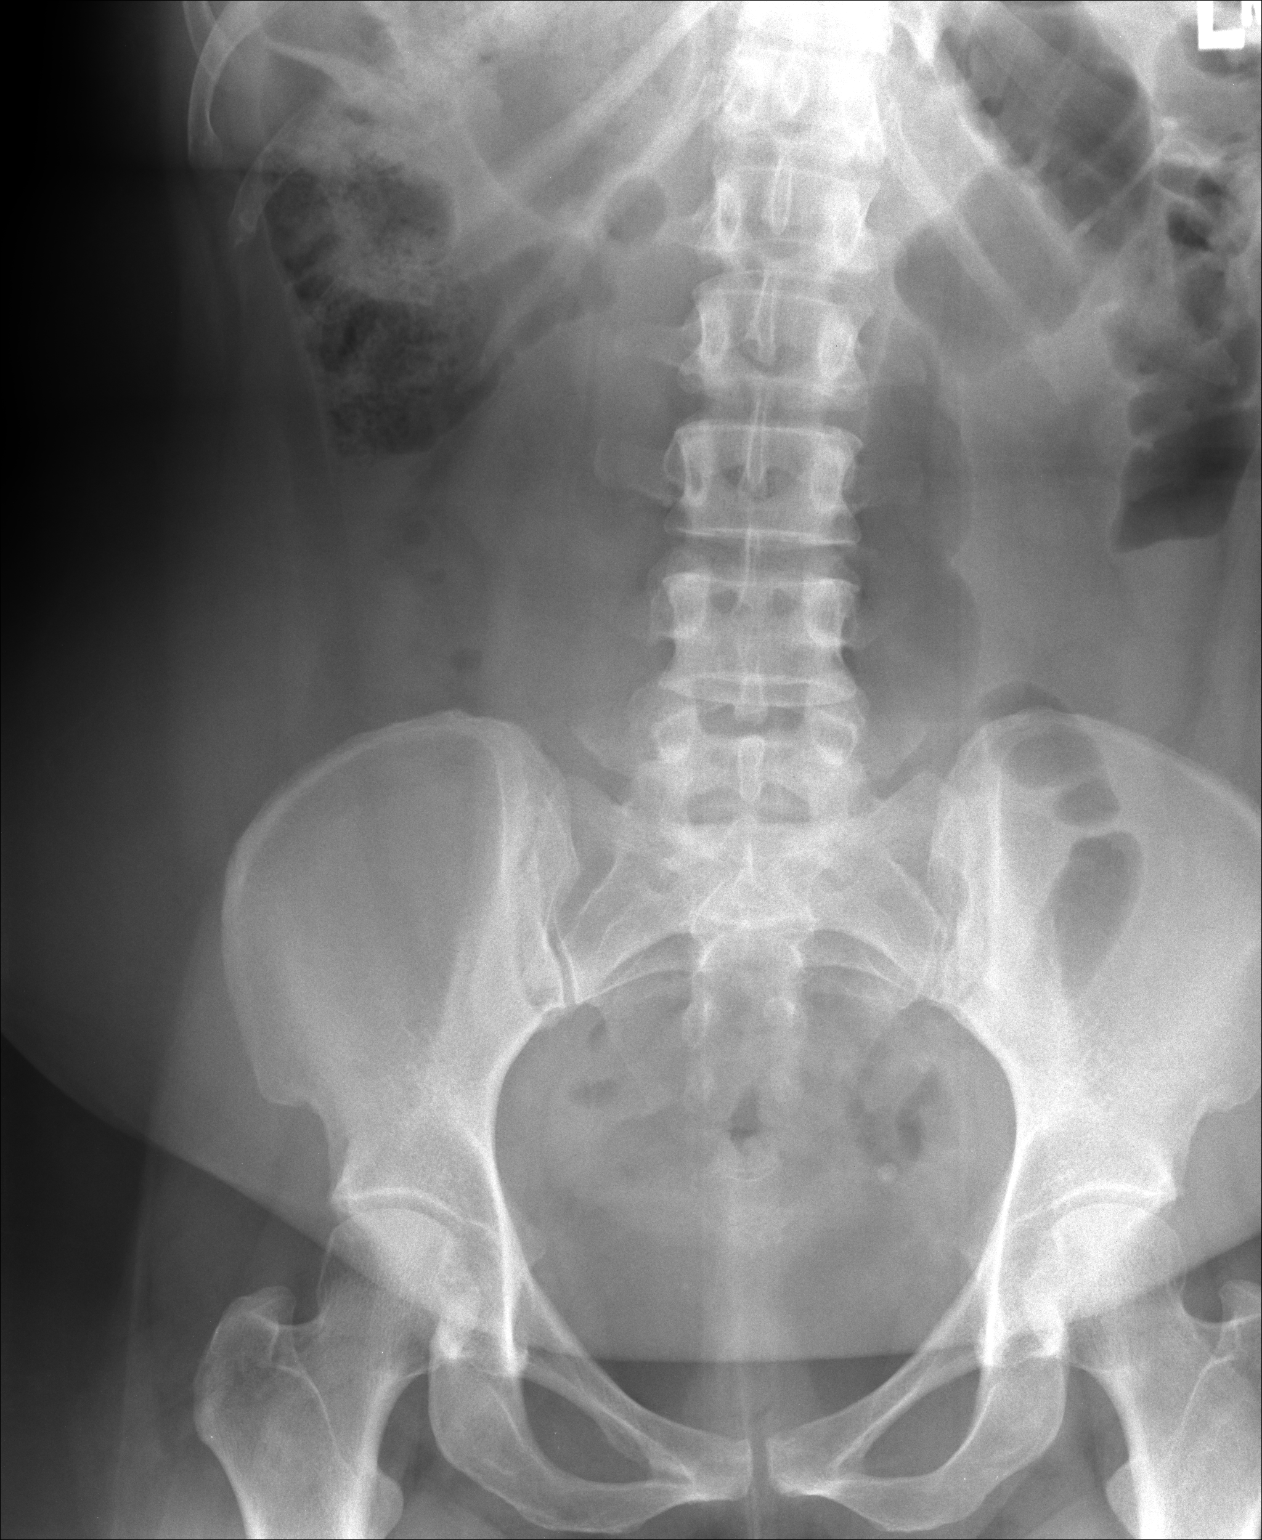

[2 of 2 positions shown; findings below may reference images not displayed]

FINDINGS: There is moderate stool in the colon. There is no bowel dilatation
or air-fluid level suggesting obstruction. No free air. There is a
phlebolith in left pelvis. Bony structures appear unremarkable.
IMPRESSION: Moderate stool in colon. Bowel gas pattern unremarkable. No bony
lesions appreciable.

## 2016-08-10 IMAGING — CR DG HIP (WITH OR WITHOUT PELVIS) 2-3V*R*
2 series · 2 of 2 positions shown · non-contrast
Comparison: None.

CLINICAL DATA: Right hip pain.

EXAM:
DG HIP (WITH OR WITHOUT PELVIS) 2-3V RIGHT

[lateral]
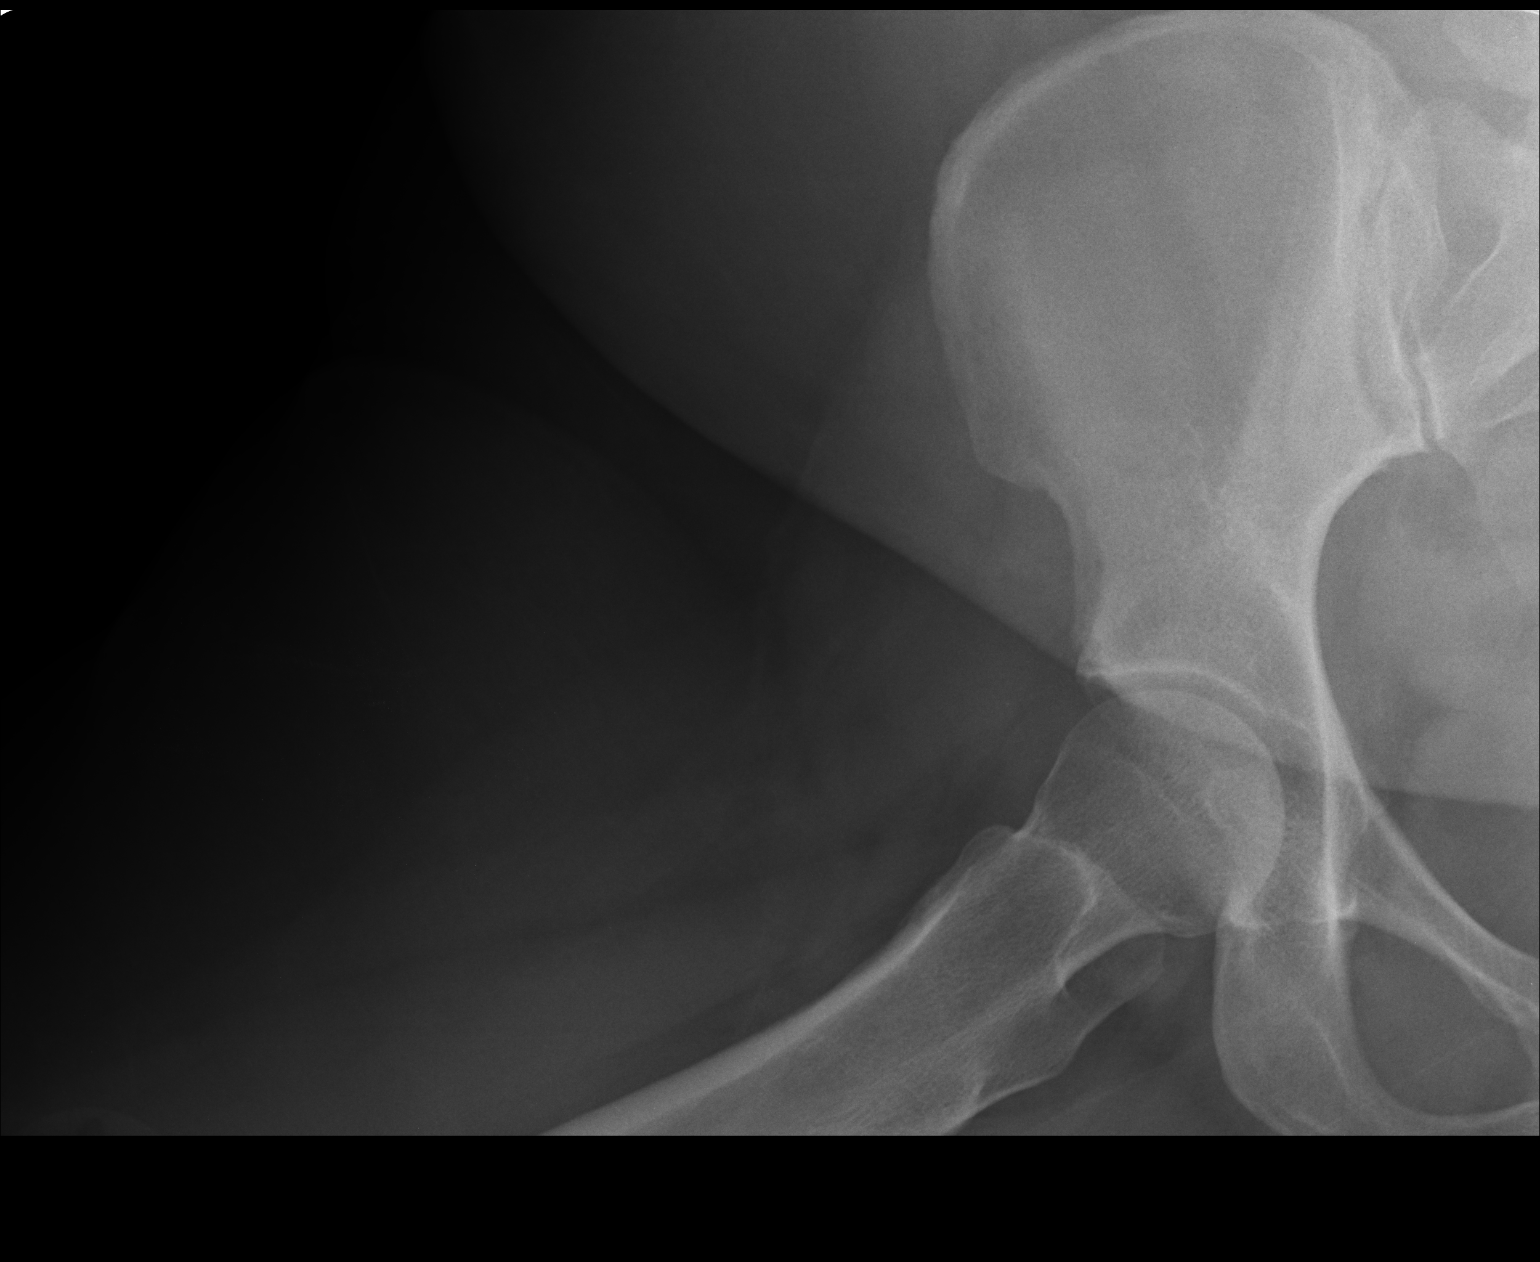

[AP]
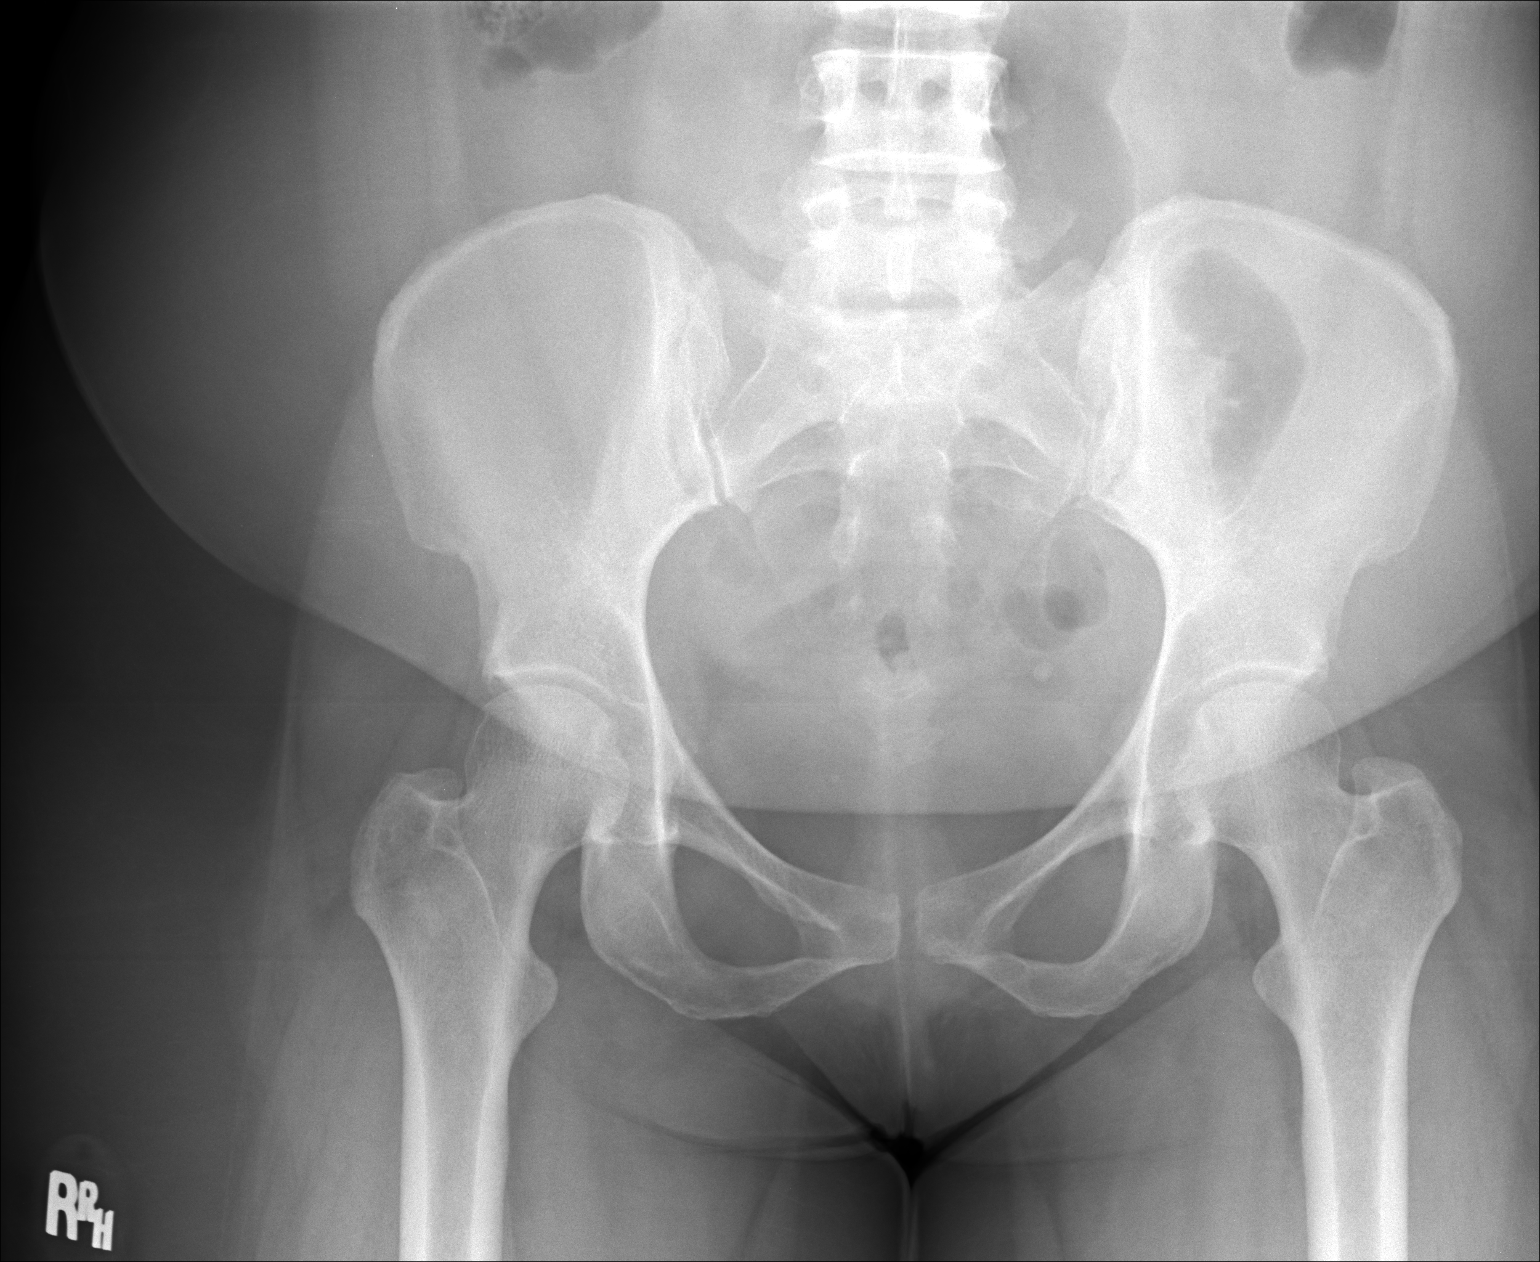

[2 of 2 positions shown; findings below may reference images not displayed]

FINDINGS: There is no evidence of hip fracture or dislocation. There is no
evidence of arthropathy or other focal bone abnormality.
IMPRESSION: Negative.

## 2016-10-12 DIAGNOSIS — Z0000 Encounter for general adult medical examination without abnormal findings: Secondary | ICD-10-CM | POA: Diagnosis not present

## 2016-10-12 DIAGNOSIS — R7309 Other abnormal glucose: Secondary | ICD-10-CM | POA: Diagnosis not present

## 2016-10-12 DIAGNOSIS — Z1389 Encounter for screening for other disorder: Secondary | ICD-10-CM | POA: Diagnosis not present

## 2016-10-12 DIAGNOSIS — K5900 Constipation, unspecified: Secondary | ICD-10-CM | POA: Diagnosis not present

## 2016-10-12 DIAGNOSIS — R21 Rash and other nonspecific skin eruption: Secondary | ICD-10-CM | POA: Diagnosis not present

## 2017-01-24 DIAGNOSIS — G8929 Other chronic pain: Secondary | ICD-10-CM | POA: Diagnosis not present

## 2017-01-24 DIAGNOSIS — M25571 Pain in right ankle and joints of right foot: Secondary | ICD-10-CM | POA: Diagnosis not present

## 2017-01-24 DIAGNOSIS — M25562 Pain in left knee: Secondary | ICD-10-CM | POA: Diagnosis not present

## 2017-01-31 DIAGNOSIS — M25562 Pain in left knee: Secondary | ICD-10-CM | POA: Diagnosis not present

## 2017-01-31 DIAGNOSIS — G8929 Other chronic pain: Secondary | ICD-10-CM | POA: Diagnosis not present

## 2017-02-02 ENCOUNTER — Ambulatory Visit (INDEPENDENT_AMBULATORY_CARE_PROVIDER_SITE_OTHER): Payer: 59 | Admitting: Family Medicine

## 2017-02-02 ENCOUNTER — Encounter: Payer: Self-pay | Admitting: Family Medicine

## 2017-02-02 VITALS — BP 120/70 | HR 112 | Temp 99.1°F | Resp 18 | Ht 63.0 in | Wt 185.2 lb

## 2017-02-02 DIAGNOSIS — J029 Acute pharyngitis, unspecified: Secondary | ICD-10-CM | POA: Diagnosis not present

## 2017-02-02 DIAGNOSIS — J069 Acute upper respiratory infection, unspecified: Secondary | ICD-10-CM | POA: Diagnosis not present

## 2017-02-02 LAB — POCT RAPID STREP A (OFFICE): RAPID STREP A SCREEN: NEGATIVE

## 2017-02-02 NOTE — Patient Instructions (Addendum)
     IF you received an x-ray today, you will receive an invoice from [facility]. Please contact [facility] at [phone] with questions or concerns regarding your invoice.   IF you received labwork today, you will receive an invoice from [facility]. Please contact [facility] at [phone] with questions or concerns regarding your invoice.   Our billing staff will not be able to assist you with questions regarding bills from these companies.  You will be contacted with the lab results as soon as they are available. The fastest way to get your results is to activate your My Chart account. Instructions are located on the last page of this paperwork. If you have not heard from us regarding the results in 2 weeks, please contact this office.     Sore Throat A sore throat is pain, burning, irritation, or scratchiness in the throat. When you have a sore throat, you may feel pain or tenderness in your throat when you swallow or talk. Many things can cause a sore throat, including:  An infection.  Seasonal allergies.  Dryness in the air.  Irritants, such as smoke or pollution.  Gastroesophageal reflux disease (GERD).  A tumor.  A sore throat is often the first sign of another sickness. It may happen with other symptoms, such as coughing, sneezing, fever, and swollen neck glands. Most sore throats go away without medical treatment. Follow these instructions at home:  Take over-the-counter medicines only as told by your health care provider.  Drink enough fluids to keep your urine clear or pale yellow.  Rest as needed.  To help with pain, try: ? Sipping warm liquids, such as broth, herbal tea, or warm water. ? Eating or drinking cold or frozen liquids, such as frozen ice pops. ? Gargling with a salt-water mixture 3-4 times a day or as needed. To make a salt-water mixture, completely dissolve -1 tsp of salt in 1 cup of warm water. ? Sucking on hard candy or throat  lozenges. ? Putting a cool-mist humidifier in your bedroom at night to moisten the air. ? Sitting in the bathroom with the door closed for 5-10 minutes while you run hot water in the shower.  Do not use any tobacco products, such as cigarettes, chewing tobacco, and e-cigarettes. If you need help quitting, ask your health care provider. Contact a health care provider if:  You have a fever for more than 2-3 days.  You have symptoms that last (are persistent) for more than 2-3 days.  Your throat does not get better within 7 days.  You have a fever and your symptoms suddenly get worse. Get help right away if:  You have difficulty breathing.  You cannot swallow fluids, soft foods, or your saliva.  You have increased swelling in your throat or neck.  You have persistent nausea and vomiting. This information is not intended to replace advice given to you by your health care provider. Make sure you discuss any questions you have with your health care provider. Document Released: 05/12/2004 Document Revised: 11/29/2015 Document Reviewed: 01/23/2015 Elsevier Interactive Patient Education  2018 [employer]  

## 2017-02-02 NOTE — Progress Notes (Signed)
Chief Complaint  Patient presents with  . Illness    ? sinus, uri or flu, pt experiencing fever, sinus pressure, ha, sneezing and bodyaches x 2 days, son dx with strep last week, no throat pain    HPI   Pt reports that she has been having high grade temperatures with sinus pressure and headache with sneezing and body aches for 2 days Her son was diagnosed with strep a week ago  She has been taking otc sinus meds No history of asthma  Non smoker  Past Medical History:  Diagnosis Date  . Anemia    Had blood transfussion APril 2013  . Anxiety   . Blood transfusion 07/13/11   [place]  . No pertinent past medical history     Current Outpatient Prescriptions  Medication Sig Dispense Refill  . cephALEXin (KEFLEX) 500 MG capsule Take 2 capsules (1,000 mg total) by mouth 2 (two) times daily. 30 capsule 0  . cetirizine (ZYRTEC) 10 MG tablet Take 10 mg by mouth 2 (two) times daily as needed. For allergies    . Cyanocobalamin (VITAMIN B12 PO) Take 1 tablet by mouth every morning.    Marland Kitchen doxycycline (VIBRAMYCIN) 100 MG capsule Take 1 capsule (100 mg total) by mouth 2 (two) times daily. (Patient not taking: Reported on 02/02/2017) 20 capsule 0  . fish oil-omega-3 fatty acids 1000 MG capsule Take 1 g by mouth every morning.    . mupirocin ointment (BACTROBAN) 2 % Apply 1 application topically 3 (three) times daily. (Patient not taking: Reported on 02/02/2017) 22 g 1  . venlafaxine (EFFEXOR) 37.5 MG tablet Take 37.5 mg by mouth 1 day or 1 dose.     No current facility-administered medications for this visit.     Allergies: No Known Allergies  Past Surgical History:  Procedure Laterality Date  . ABDOMINAL HYSTERECTOMY    . CESAREAN SECTION    . DIAGNOSTIC LAPAROSCOPY      Social History   Social History  . Marital status: Married    Spouse name: N/A  . Number of children: N/A  . Years of education: N/A   Social History Main Topics  . Smoking status: Never Smoker  . Smokeless tobacco:  Never Used  . Alcohol use No  . Drug use: No  . Sexual activity: Yes    Birth control/ protection: None   Other Topics Concern  . None   Social History Narrative   Lives with husband and 3 children    ROS Review of Systems See HPI Constitution: No fevers or chills No malaise No diaphoresis Skin: No rash or itching Eyes: no blurry vision, no double vision GU: no dysuria or hematuria Neuro: no dizziness or headaches  Objective: Vitals:   02/02/17 0908  BP: 120/70  Pulse: (!) 112  Resp: 18  Temp: 99.1 F (37.3 C)  TempSrc: Oral  SpO2: 97%  Weight: 185 lb 3.2 oz (84 kg)  Height: 5\' 3"  (1.6 m)    Physical Exam General: alert, oriented, in NAD Head: normocephalic, atraumatic, no sinus tenderness Eyes: EOM intact, no scleral icterus or conjunctival injection Ears: TM clear bilaterally Nose: mucosa nonerythematous, nonedematous Throat: no pharyngeal exudate or erythema Lymph: no posterior auricular, submental or cervical lymph adenopathy Heart: normal rate, normal sinus rhythm, no murmurs Lungs: clear to auscultation bilaterally, no wheezing   Assessment and Plan Earlie was seen today for illness.  Diagnoses and all orders for this visit:  Acute pharyngitis, unspecified etiology- strep negative Supportive care -  POCT rapid strep A  Acute URI- continue supportive care  Other orders -     Cancel: Tdap vaccine greater than or equal to 7yo IM     Tonnia Bardin A [employer]

## 2017-02-08 DIAGNOSIS — M25562 Pain in left knee: Secondary | ICD-10-CM | POA: Diagnosis not present

## 2017-02-08 DIAGNOSIS — G8929 Other chronic pain: Secondary | ICD-10-CM | POA: Diagnosis not present

## 2017-04-24 DIAGNOSIS — J309 Allergic rhinitis, unspecified: Secondary | ICD-10-CM | POA: Diagnosis not present

## 2017-04-24 DIAGNOSIS — K5900 Constipation, unspecified: Secondary | ICD-10-CM | POA: Diagnosis not present

## 2017-04-26 DIAGNOSIS — R1313 Dysphagia, pharyngeal phase: Secondary | ICD-10-CM | POA: Diagnosis not present

## 2017-05-02 DIAGNOSIS — R0989 Other specified symptoms and signs involving the circulatory and respiratory systems: Secondary | ICD-10-CM | POA: Diagnosis not present

## 2017-07-25 DIAGNOSIS — M25562 Pain in left knee: Secondary | ICD-10-CM | POA: Diagnosis not present

## 2017-07-25 DIAGNOSIS — M1712 Unilateral primary osteoarthritis, left knee: Secondary | ICD-10-CM | POA: Diagnosis not present

## 2017-09-13 DIAGNOSIS — Z1231 Encounter for screening mammogram for malignant neoplasm of breast: Secondary | ICD-10-CM | POA: Diagnosis not present

## 2017-09-13 DIAGNOSIS — Z01419 Encounter for gynecological examination (general) (routine) without abnormal findings: Secondary | ICD-10-CM | POA: Diagnosis not present

## 2017-11-28 DIAGNOSIS — M25562 Pain in left knee: Secondary | ICD-10-CM | POA: Diagnosis not present

## 2017-11-28 DIAGNOSIS — L729 Follicular cyst of the skin and subcutaneous tissue, unspecified: Secondary | ICD-10-CM | POA: Diagnosis not present

## 2017-12-13 DIAGNOSIS — K5900 Constipation, unspecified: Secondary | ICD-10-CM | POA: Diagnosis not present

## 2017-12-13 DIAGNOSIS — Z0000 Encounter for general adult medical examination without abnormal findings: Secondary | ICD-10-CM | POA: Diagnosis not present

## 2017-12-13 DIAGNOSIS — Z1389 Encounter for screening for other disorder: Secondary | ICD-10-CM | POA: Diagnosis not present

## 2017-12-22 DIAGNOSIS — D1720 Benign lipomatous neoplasm of skin and subcutaneous tissue of unspecified limb: Secondary | ICD-10-CM | POA: Diagnosis not present

## 2018-01-30 DIAGNOSIS — B373 Candidiasis of vulva and vagina: Secondary | ICD-10-CM | POA: Diagnosis not present

## 2018-08-04 ENCOUNTER — Encounter (HOSPITAL_BASED_OUTPATIENT_CLINIC_OR_DEPARTMENT_OTHER): Payer: Self-pay | Admitting: *Deleted

## 2018-08-04 ENCOUNTER — Emergency Department (HOSPITAL_BASED_OUTPATIENT_CLINIC_OR_DEPARTMENT_OTHER): Payer: 59

## 2018-08-04 ENCOUNTER — Other Ambulatory Visit: Payer: Self-pay

## 2018-08-04 ENCOUNTER — Emergency Department (HOSPITAL_BASED_OUTPATIENT_CLINIC_OR_DEPARTMENT_OTHER)
Admission: EM | Admit: 2018-08-04 | Discharge: 2018-08-04 | Disposition: A | Discharge status: Home / Self Care | Payer: 59 | Attending: Emergency Medicine | Admitting: Emergency Medicine

## 2018-08-04 DIAGNOSIS — Z79899 Other long term (current) drug therapy: Secondary | ICD-10-CM | POA: Insufficient documentation

## 2018-08-04 DIAGNOSIS — F419 Anxiety disorder, unspecified: Secondary | ICD-10-CM | POA: Insufficient documentation

## 2018-08-04 DIAGNOSIS — R0602 Shortness of breath: Secondary | ICD-10-CM | POA: Insufficient documentation

## 2018-08-04 LAB — COMPREHENSIVE METABOLIC PANEL
ALT: 18 U/L (ref 0–44)
AST: 20 U/L (ref 15–41)
Albumin: 3.9 g/dL (ref 3.5–5.0)
Alkaline Phosphatase: 85 U/L (ref 38–126)
Anion gap: 6 (ref 5–15)
BUN: 13 mg/dL (ref 6–20)
CO2: 23 mmol/L (ref 22–32)
Calcium: 8.9 mg/dL (ref 8.9–10.3)
Chloride: 109 mmol/L (ref 98–111)
Creatinine, Ser: 0.51 mg/dL (ref 0.44–1.00)
GFR calc Af Amer: >60 mL/min (ref >60)
GFR calc non Af Amer: >60 mL/min (ref >60)
Glucose, Bld: 103 mg/dL — ABNORMAL HIGH (ref 70–99)
Potassium: 3.6 mmol/L (ref 3.5–5.1)
Sodium: 138 mmol/L (ref 135–145)
Total Bilirubin: 0.4 mg/dL (ref 0.3–1.2)
Total Protein: 7.3 g/dL (ref 6.5–8.1)

## 2018-08-04 LAB — CBC WITH DIFFERENTIAL/PLATELET
Abs Immature Granulocytes: 0.02 10*3/uL (ref 0.00–0.07)
Basophils Absolute: 0 10*3/uL (ref 0.0–0.1)
Basophils Relative: 0 %
Eosinophils Absolute: 0.3 10*3/uL (ref 0.0–0.5)
Eosinophils Relative: 4 %
HCT: 37.4 % (ref 36.0–46.0)
Hemoglobin: 13.2 g/dL (ref 12.0–15.0)
Immature Granulocytes: 0 %
Lymphocytes Relative: 25 %
Lymphs Abs: 2.4 10*3/uL (ref 0.7–4.0)
MCH: 27.2 pg (ref 26.0–34.0)
MCHC: 35.3 g/dL (ref 30.0–36.0)
MCV: 77 fL — ABNORMAL LOW (ref 80.0–100.0)
Monocytes Absolute: 1 10*3/uL (ref 0.1–1.0)
Monocytes Relative: 10 %
Neutro Abs: 6 10*3/uL (ref 1.7–7.7)
Neutrophils Relative %: 61 %
Platelets: 349 10*3/uL (ref 150–400)
RBC: 4.86 MIL/uL (ref 3.87–5.11)
RDW: 14.4 % (ref 11.5–15.5)
WBC: 9.7 10*3/uL (ref 4.0–10.5)
nRBC: 0 % (ref 0.0–0.2)

## 2018-08-04 LAB — TROPONIN I: Troponin I: <0.03 ng/mL (ref <0.03)

## 2018-08-04 LAB — GROUP A STREP BY PCR: Group A Strep by PCR: NOT DETECTED

## 2018-08-04 MED ORDER — ALBUTEROL SULFATE HFA 108 (90 BASE) MCG/ACT IN AERS
2.0000 | INHALATION_SPRAY | Freq: Once | RESPIRATORY_TRACT | Status: AC
  Administered 2018-08-04: 2 via RESPIRATORY_TRACT
  Filled 2018-08-04: qty 6.7

## 2018-08-04 NOTE — ED Provider Notes (Signed)
[facility] DEPART[facility] Provider Note   CSN: [id number] Arrival date & time: 08/04/18  1841    History   Chief Complaint Chief Complaint  Patient presents with  . Shortness of Breath    HPI Megan Burch is a 48 y.o. female history of anemia here presenting with shortness of breath, anxiety. Patient states that for the last 3 to 4 days, she has been having some subjective shortness of breath.  She also has some sore throat and body aches as well.  Patient denies any actual fevers.  Patient denies any sick contacts and does not have known COVID 19 exposures. Denies any recent travel. Patient thought she was wheezing so used her child's albuterol. Previous history of asthma as a child, denies smoking.      The history is provided by the patient.    Past Medical History:  Diagnosis Date  . Anemia    Had blood transfussion APril 2013  . Anxiety   . Blood transfusion 07/13/11   [place]  . No pertinent past medical history     Patient Active Problem List   Diagnosis Date Noted  . CN (constipation) 12/25/2014  . Generalized abdominal pain 12/25/2014  . Obesity 12/25/2014  . Right hip pain 12/25/2014    Past Surgical History:  Procedure Laterality Date  . ABDOMINAL HYSTERECTOMY    . CESAREAN SECTION    . DIAGNOSTIC LAPAROSCOPY       OB History    Gravida  3   Para  3   Term      Preterm      AB      Living  3     SAB      TAB      Ectopic      Multiple      Live Births               Home Medications    Prior to Admission medications   Medication Sig Start Date End Date Taking? Authorizing Provider  cephALEXin (KEFLEX) 500 MG capsule Take 2 capsules (1,000 mg total) by mouth 2 (two) times daily. 01/29/16   Scot Jun, FNP  cetirizine (ZYRTEC) 10 MG tablet Take 10 mg by mouth 2 (two) times daily as needed. For allergies    [provider]  Cyanocobalamin (VITAMIN B12 PO) Take 1 tablet by mouth every morning.     [provider]  doxycycline (VIBRAMYCIN) 100 MG capsule Take 1 capsule (100 mg total) by mouth 2 (two) times daily. Patient not taking: Reported on 02/02/2017 01/27/16   Scot Jun, FNP  fish oil-omega-3 fatty acids 1000 MG capsule Take 1 g by mouth every morning.    [provider]  mupirocin ointment (BACTROBAN) 2 % Apply 1 application topically 3 (three) times daily. Patient not taking: Reported on 02/02/2017 01/29/16   Scot Jun, FNP  venlafaxine (EFFEXOR) 37.5 MG tablet Take 37.5 mg by mouth 1 day or 1 dose.    [provider]    Family History Family History  Problem Relation Age of Onset  . Diabetes Maternal Grandmother   . Hyperlipidemia Maternal Grandmother   . Stroke Paternal Grandmother   . Hyperlipidemia Paternal Grandmother   . Diabetes Paternal Grandmother   . Heart disease Paternal Grandmother     Social History Social History   Tobacco Use  . Smoking status: Never Smoker  . Smokeless tobacco: Never Used  Substance Use Topics  . Alcohol  use: No  . Drug use: No     Allergies   Patient has no known allergies.   Review of Systems Review of Systems  Respiratory: Positive for shortness of breath.   All other systems reviewed and are negative.    Physical Exam Updated Vital Signs BP (!) 143/97 (BP Location: Right Arm)   Pulse (!) 104   Temp 98.4 F (36.9 C) (Oral)   Resp 20   Ht 5\' 2"  (1.575 m)   Wt 86.2 kg   LMP 06/28/2011   SpO2 97%   BMI 34.75 kg/m   Physical Exam Vitals signs and nursing note reviewed.  HENT:     Head: Normocephalic.     Mouth/Throat:     Mouth: Mucous membranes are moist.     Comments: Posterior pharynx slightly red, no tonsillar exudates  Eyes:     Extraocular Movements: Extraocular movements intact.     Pupils: Pupils are equal, round, and reactive to light.  Neck:     Musculoskeletal: Normal range of motion.  Cardiovascular:     Rate and Rhythm: Normal rate and regular  rhythm.  Pulmonary:     Effort: Pulmonary effort is normal.     Breath sounds: Normal breath sounds.  Abdominal:     General: Bowel sounds are normal.     Palpations: Abdomen is soft.  Musculoskeletal: Normal range of motion.  Skin:    General: Skin is warm.     Capillary Refill: Capillary refill takes less than 2 seconds.  Neurological:     General: No focal deficit present.     Mental Status: She is alert and oriented to person, place, and time.  Psychiatric:        Mood and Affect: Mood normal.        Behavior: Behavior normal.      ED Treatments / Results  Labs (all labs ordered are listed, but only abnormal results are displayed) Labs Reviewed  GROUP A STREP BY PCR  CBC WITH DIFFERENTIAL/PLATELET  COMPREHENSIVE METABOLIC PANEL  TROPONIN I    EKG EKG Interpretation  Date/Time:  Saturday August 04 2018 19:20:09 EDT Ventricular Rate:  92 PR Interval:    QRS Duration: 78 QT Interval:  340 QTC Calculation: 421 R Axis:   54 Text Interpretation:  Sinus rhythm Baseline wander in lead(s) V6 No significant change since last tracing Confirmed by Wandra Arthurs [phone]) on 08/04/2018 7:21:56 PM   Radiology No results found.  Procedures Procedures (including critical care time)  Medications Ordered in ED Medications  albuterol (VENTOLIN HFA) 108 (90 Base) MCG/ACT inhaler 2 puff (has no administration in time range)     Initial Impression / Assessment and Plan / ED Course  I have reviewed the triage vital signs and the nursing notes.  Pertinent labs & imaging results that were available during my care of the patient were reviewed by me and considered in my medical decision making (see chart for details).       Megan Burch is a 48 y.o. female here with sore throat, cough. Patient concerned for possible COVID 19 but has no known exposures. Patient doesn't meet criteria for testing currently. Will get strep test, CXR, labs.   8:36 PM Labs unremarkable. CXR clear.  Strep pending and is a send out to [facility]. We will call her with results. Told her to quarantine herself according to guidelines. She can use albuterol prn.   Megan Burch was evaluated in Emergency Department on  08/04/2018 for the symptoms described in the history of present illness. She was evaluated in the context of the global COVID-19 pandemic, which necessitated consideration that the patient might be at risk for infection with the SARS-CoV-2 virus that causes COVID-19. Institutional protocols and algorithms that pertain to the evaluation of patients at risk for COVID-19 are in a state of rapid change based on information released by regulatory bodies including the CDC and federal and state organizations. These policies and algorithms were followed during the patient's care in the ED.    Final Clinical Impressions(s) / ED Diagnoses   Final diagnoses:  None    ED Discharge Orders    None       Drenda Freeze, MD 08/04/18 2036

## 2018-08-04 NOTE — Progress Notes (Signed)
RN gave Patient albuterol treatment.

## 2018-08-04 NOTE — Discharge Instructions (Signed)
Take tylenol for fever   See your doctor.   Strep test was sent and is pending. You will be called for positive results. You can also find out on MyChart   Return to ER if you have worse shortness of breath, fever. See COVID 19 instructions below.      Person Under Monitoring Name: Megan Burch  Location: [address] [place] [address]   Infection Prevention Recommendations for Individuals Confirmed to have, or Being Evaluated for, 2019 Novel Coronavirus (COVID-19) Infection Who Receive Care at Home  Individuals who are confirmed to have, or are being evaluated for, COVID-19 should follow the prevention steps below until a healthcare provider or local or state health department says they can return to normal activities.  Stay home except to get medical care You should restrict activities outside your home, except for getting medical care. Do not go to work, school, or public areas, and do not use public transportation or taxis.  Call ahead before visiting your doctor Before your medical appointment, call the healthcare provider and tell them that you have, or are being evaluated for, COVID-19 infection. This will help the healthcare providers office take steps to keep other people from getting infected. Ask your healthcare provider to call the local or state health department.  Monitor your symptoms Seek prompt medical attention if your illness is worsening (e.g., difficulty breathing). Before going to your medical appointment, call the healthcare provider and tell them that you have, or are being evaluated for, COVID-19 infection. Ask your healthcare provider to call the local or state health department.  Wear a facemask You should wear a facemask that covers your nose and mouth when you are in the same room with other people and when you visit a healthcare provider. People who live with or visit you should also wear a facemask while they are in the same room with  you.  Separate yourself from other people in your home As much as possible, you should stay in a different room from other people in your home. Also, you should use a separate bathroom, if available.  Avoid sharing household items You should not share dishes, drinking glasses, cups, eating utensils, towels, bedding, or other items with other people in your home. After using these items, you should wash them thoroughly with soap and water.  Cover your coughs and sneezes Cover your mouth and nose with a tissue when you cough or sneeze, or you can cough or sneeze into your sleeve. Throw used tissues in a lined trash can, and immediately wash your hands with soap and water for at least 20 seconds or use an alcohol-based hand rub.  Wash your [facility] your hands often and thoroughly with soap and water for at least 20 seconds. You can use an alcohol-based hand sanitizer if soap and water are not available and if your hands are not visibly dirty. Avoid touching your eyes, nose, and mouth with unwashed hands.   Prevention Steps for Caregivers and Household Members of Individuals Confirmed to have, or Being Evaluated for, COVID-19 Infection Being Cared for in the Home  If you live with, or provide care at home for, a person confirmed to have, or being evaluated for, COVID-19 infection please follow these guidelines to prevent infection:  Follow healthcare providers instructions Make sure that you understand and can help the patient follow any healthcare provider instructions for all care.  Provide for the patients basic needs You should help the patient with  basic needs in the home and provide support for getting groceries, prescriptions, and other personal needs.  Monitor the patients symptoms If they are getting sicker, call his or her medical provider and tell them that the patient has, or is being evaluated for, COVID-19 infection. This will help the healthcare providers office  take steps to keep other people from getting infected. Ask the healthcare provider to call the local or state health department.  Limit the number of people who have contact with the patient If possible, have only one caregiver for the patient. Other household members should stay in another home or place of residence. If this is not possible, they should stay in another room, or be separated from the patient as much as possible. Use a separate bathroom, if available. Restrict visitors who do not have an essential need to be in the home.  Keep older adults, very young children, and other sick people away from the patient Keep older adults, very young children, and those who have compromised immune systems or chronic health conditions away from the patient. This includes people with chronic heart, lung, or kidney conditions, diabetes, and cancer.  Ensure good ventilation Make sure that shared spaces in the home have good air flow, such as from an air conditioner or an opened window, weather permitting.  Wash your hands often Wash your hands often and thoroughly with soap and water for at least 20 seconds. You can use an alcohol based hand sanitizer if soap and water are not available and if your hands are not visibly dirty. Avoid touching your eyes, nose, and mouth with unwashed hands. Use disposable paper towels to dry your hands. If not available, use dedicated cloth towels and replace them when they become wet.  Wear a facemask and gloves Wear a disposable facemask at all times in the room and gloves when you touch or have contact with the patients blood, body fluids, and/or secretions or excretions, such as sweat, saliva, sputum, nasal mucus, vomit, urine, or feces.  Ensure the mask fits over your nose and mouth tightly, and do not touch it during use. Throw out disposable facemasks and gloves after using them. Do not reuse. Wash your hands immediately after removing your facemask and  gloves. If your personal clothing becomes contaminated, carefully remove clothing and launder. Wash your hands after handling contaminated clothing. Place all used disposable facemasks, gloves, and other waste in a lined container before disposing them with other household waste. Remove gloves and wash your hands immediately after handling these items.  Do not share dishes, glasses, or other household items with the patient Avoid sharing household items. You should not share dishes, drinking glasses, cups, eating utensils, towels, bedding, or other items with a patient who is confirmed to have, or being evaluated for, COVID-19 infection. After the person uses these items, you should wash them thoroughly with soap and water.  Wash laundry thoroughly Immediately remove and wash clothes or bedding that have blood, body fluids, and/or secretions or excretions, such as sweat, saliva, sputum, nasal mucus, vomit, urine, or feces, on them. Wear gloves when handling laundry from the patient. Read and follow directions on labels of laundry or clothing items and detergent. In general, wash and dry with the warmest temperatures recommended on the label.  Clean all areas the individual has used often Clean all touchable surfaces, such as counters, tabletops, doorknobs, bathroom fixtures, toilets, phones, keyboards, tablets, and bedside tables, every day. Also, clean any surfaces that may have  blood, body fluids, and/or secretions or excretions on them. Wear gloves when cleaning surfaces the patient has come in contact with. Use a diluted bleach solution (e.g., dilute bleach with 1 part bleach and 10 parts water) or a household disinfectant with a label that says EPA-registered for coronaviruses. To make a bleach solution at home, add 1 tablespoon of bleach to 1 quart (4 cups) of water. For a larger supply, add  cup of bleach to 1 gallon (16 cups) of water. Read labels of cleaning products and follow  recommendations provided on product labels. Labels contain instructions for safe and effective use of the cleaning product including precautions you should take when applying the product, such as wearing gloves or eye protection and making sure you have good ventilation during use of the product. Remove gloves and wash hands immediately after cleaning.  Monitor yourself for signs and symptoms of illness Caregivers and household members are considered close contacts, should monitor their health, and will be asked to limit movement outside of the home to the extent possible. Follow the monitoring steps for close contacts listed on the symptom monitoring form.   ? If you have additional questions, contact your local health department or call the epidemiologist on call at [phone] (available 24/7). ? This guidance is subject to change. For the most up-to-date guidance from [facility], please refer to their website: [email or web address]

## 2018-08-04 NOTE — ED Notes (Signed)
Patient admits to taking children's Albuterol HFA 2 twice today last @ 16:00 with some relief.

## 2018-08-04 NOTE — ED Triage Notes (Addendum)
Pt reports SOB x 3-4 days. States she has had to use her children's albuterol. Denies known fever, endorses body aches,denies cough but states she has been clearing her throat a lot

## 2018-08-23 DIAGNOSIS — Z111 Encounter for screening for respiratory tuberculosis: Secondary | ICD-10-CM | POA: Diagnosis not present

## 2019-01-31 ENCOUNTER — Other Ambulatory Visit: Payer: Self-pay

## 2019-01-31 DIAGNOSIS — Z20822 Contact with and (suspected) exposure to covid-19: Secondary | ICD-10-CM

## 2019-02-02 ENCOUNTER — Telehealth: Payer: Self-pay

## 2019-02-02 LAB — NOVEL CORONAVIRUS, NAA: SARS-CoV-2, NAA: NOT DETECTED

## 2019-02-02 NOTE — Telephone Encounter (Signed)
Pt wanting results of covid testing- informed results are not back.

## 2019-02-21 ENCOUNTER — Other Ambulatory Visit: Payer: Self-pay | Admitting: Obstetrics and Gynecology

## 2019-02-21 DIAGNOSIS — N6310 Unspecified lump in the right breast, unspecified quadrant: Secondary | ICD-10-CM

## 2019-02-21 DIAGNOSIS — R2231 Localized swelling, mass and lump, right upper limb: Secondary | ICD-10-CM

## 2019-02-26 ENCOUNTER — Other Ambulatory Visit: Payer: Self-pay | Admitting: Obstetrics and Gynecology

## 2019-02-26 ENCOUNTER — Ambulatory Visit
Admission: RE | Admit: 2019-02-26 | Discharge: 2019-02-26 | Disposition: A | Discharge status: Home / Self Care | Payer: 59 | Source: Ambulatory Visit | Attending: Obstetrics and Gynecology | Admitting: Obstetrics and Gynecology

## 2019-02-26 ENCOUNTER — Other Ambulatory Visit: Payer: Self-pay

## 2019-02-26 DIAGNOSIS — R2231 Localized swelling, mass and lump, right upper limb: Secondary | ICD-10-CM

## 2019-04-09 ENCOUNTER — Other Ambulatory Visit: Payer: 59

## 2019-04-09 ENCOUNTER — Other Ambulatory Visit: Payer: Self-pay

## 2019-04-09 ENCOUNTER — Ambulatory Visit: Payer: 59 | Attending: Internal Medicine

## 2019-04-09 DIAGNOSIS — Z20822 Contact with and (suspected) exposure to covid-19: Secondary | ICD-10-CM

## 2019-04-10 LAB — NOVEL CORONAVIRUS, NAA: SARS-CoV-2, NAA: NOT DETECTED

## 2019-06-29 ENCOUNTER — Ambulatory Visit: Payer: 59 | Attending: Internal Medicine

## 2019-06-29 DIAGNOSIS — Z23 Encounter for immunization: Secondary | ICD-10-CM

## 2019-06-29 NOTE — Progress Notes (Signed)
   Covid-19 Vaccination Clinic  Name:  Megan Burch    MRN: [id number] DOB: Jul 07, 1970  06/29/2019  [name] was observed post Covid-19 immunization for 15 minutes without incident. She was provided with Vaccine Information Sheet and instruction to access the V-Safe system.   [name] was instructed to call 911 with any severe reactions post vaccine: Marland Kitchen Difficulty breathing  . Swelling of face and throat  . A fast heartbeat  . A bad rash all over body  . Dizziness and weakness   Immunizations Administered    Name Date Dose VIS Date Route   Pfizer COVID-19 Vaccine 06/29/2019  8:48 AM 0.3 mL 03/29/2019 Intramuscular   Manufacturer: [place]   Lot: [id number]   [place]: [id number]

## 2019-07-22 ENCOUNTER — Ambulatory Visit: Payer: 59

## 2019-07-29 ENCOUNTER — Ambulatory Visit: Payer: 59 | Attending: Internal Medicine

## 2019-07-29 DIAGNOSIS — Z23 Encounter for immunization: Secondary | ICD-10-CM

## 2019-07-29 NOTE — Progress Notes (Signed)
   Covid-19 Vaccination Clinic  Name:  Megan Burch    MRN: [id number] DOB: Jun 16, 1970  07/29/2019  [name] was observed post Covid-19 immunization for 15 minutes without incident. She was provided with Vaccine Information Sheet and instruction to access the V-Safe system.   [name] was instructed to call 911 with any severe reactions post vaccine: Marland Kitchen Difficulty breathing  . Swelling of face and throat  . A fast heartbeat  . A bad rash all over body  . Dizziness and weakness   Immunizations Administered    Name Date Dose VIS Date Route   Pfizer COVID-19 Vaccine 07/29/2019  4:32 PM 0.3 mL 03/29/2019 Intramuscular   Manufacturer: [place]   Lot: [id number]   [place]: [id number]

## 2019-11-08 ENCOUNTER — Other Ambulatory Visit: Payer: 59

## 2020-08-06 ENCOUNTER — Other Ambulatory Visit: Payer: Self-pay | Admitting: Internal Medicine

## 2020-08-06 DIAGNOSIS — Z1231 Encounter for screening mammogram for malignant neoplasm of breast: Secondary | ICD-10-CM

## 2020-09-25 ENCOUNTER — Ambulatory Visit
Admission: RE | Admit: 2020-09-25 | Discharge: 2020-09-25 | Disposition: A | Discharge status: Home / Self Care | Payer: 59 | Source: Ambulatory Visit | Attending: Internal Medicine | Admitting: Internal Medicine

## 2020-09-25 ENCOUNTER — Other Ambulatory Visit: Payer: Self-pay

## 2020-09-25 DIAGNOSIS — Z1231 Encounter for screening mammogram for malignant neoplasm of breast: Secondary | ICD-10-CM

## 2020-10-12 IMAGING — MG DIGITAL DIAGNOSTIC BILAT W/ TOMO W/ CAD
6 of 10 series · 6 of 30 positions shown · non-contrast
Comparison: Previous exam(s).

CLINICAL DATA: 48-year-old female presenting for evaluation of a
palpable lump in the right axilla for the past year.

EXAM:
DIGITAL DIAGNOSTIC BILATERAL MAMMOGRAM WITH CAD AND TOMO
ULTRASOUND RIGHT AXILLA

[L CC synth-2D]
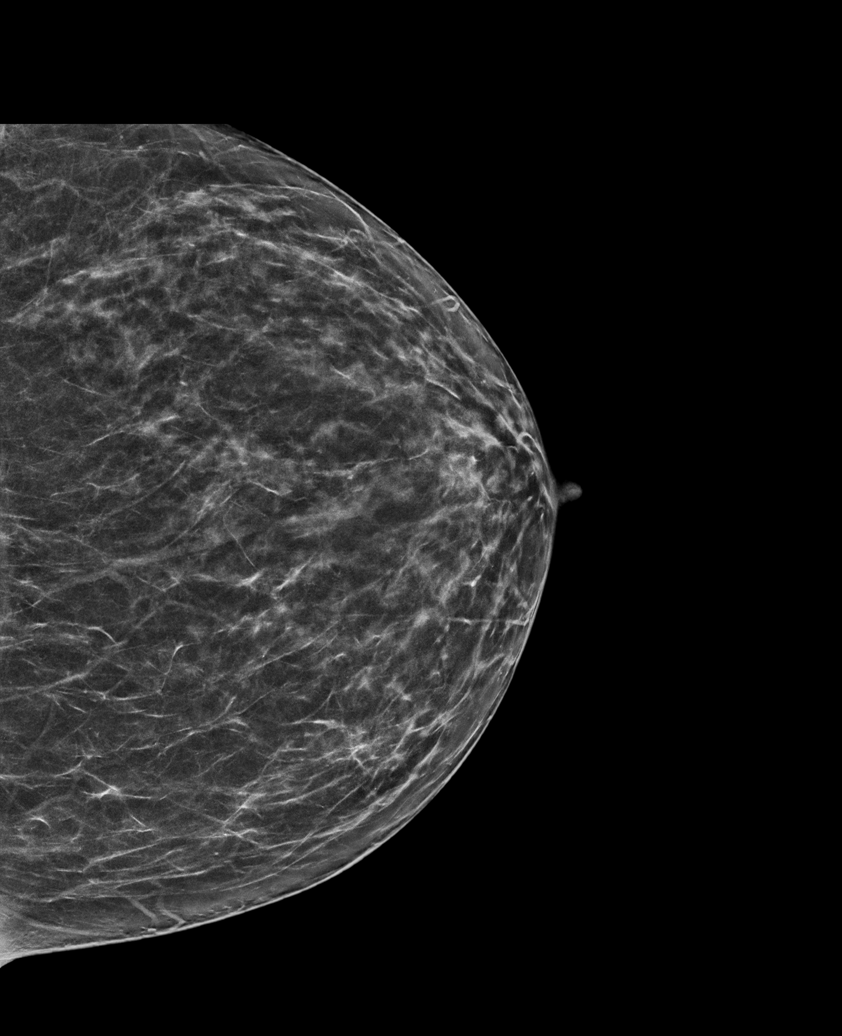

[R CC synth-2D]
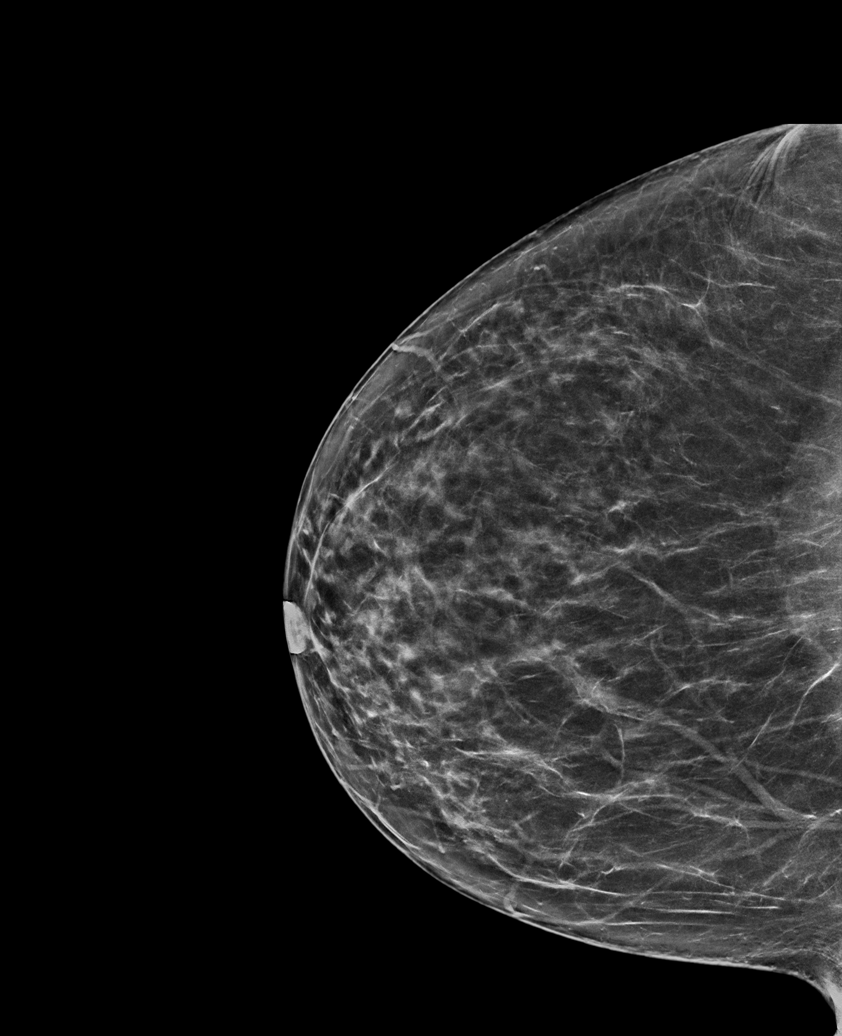

[R MLO synth-2D (1 of 2)]
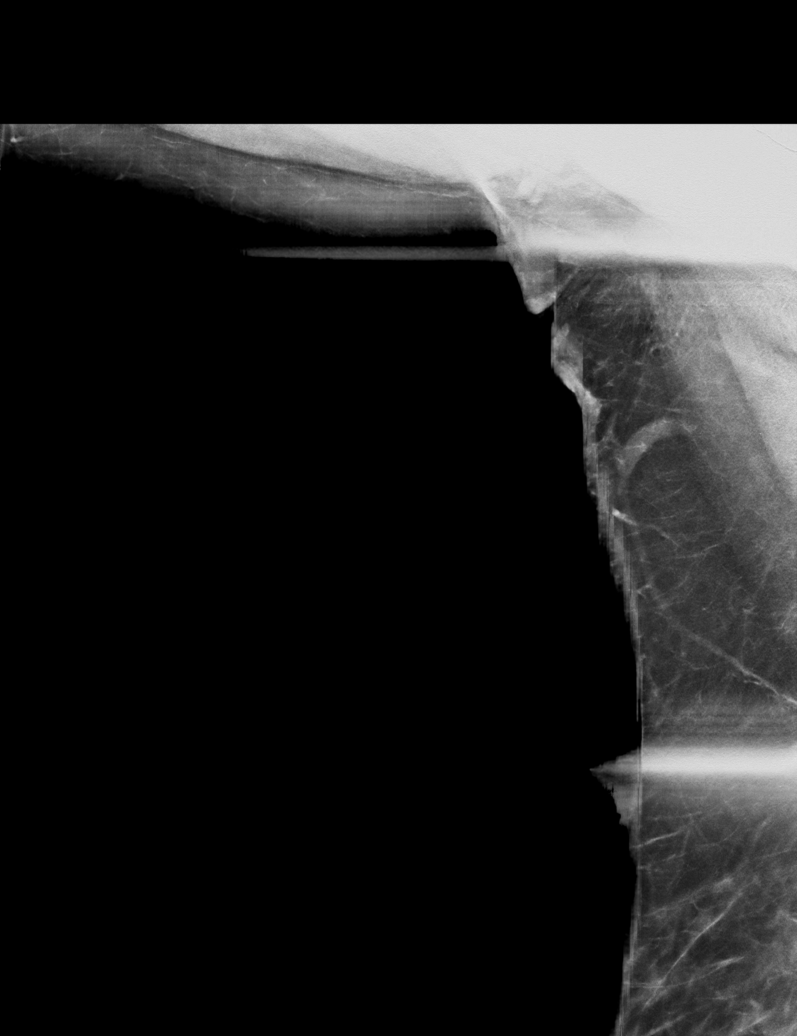

[L MLO synth-2D]
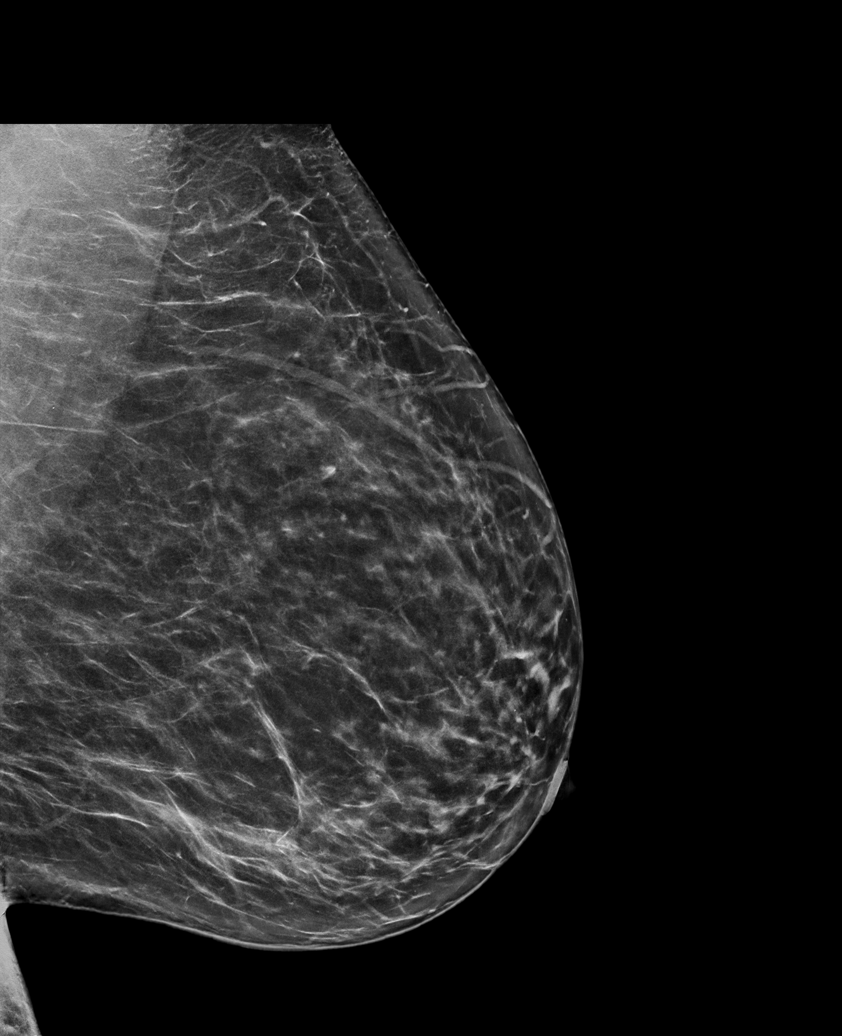

[R MLO synth-2D (2 of 2)]
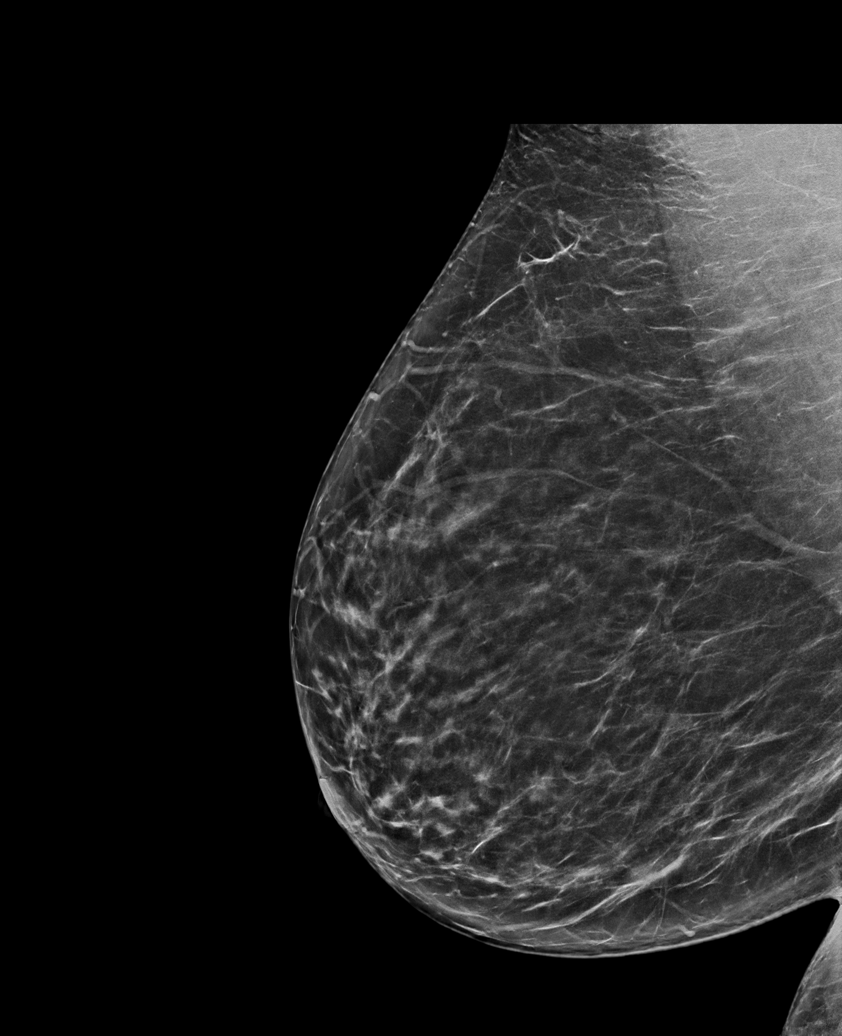

[L MLO tomo · tomo slice 41/81.0]
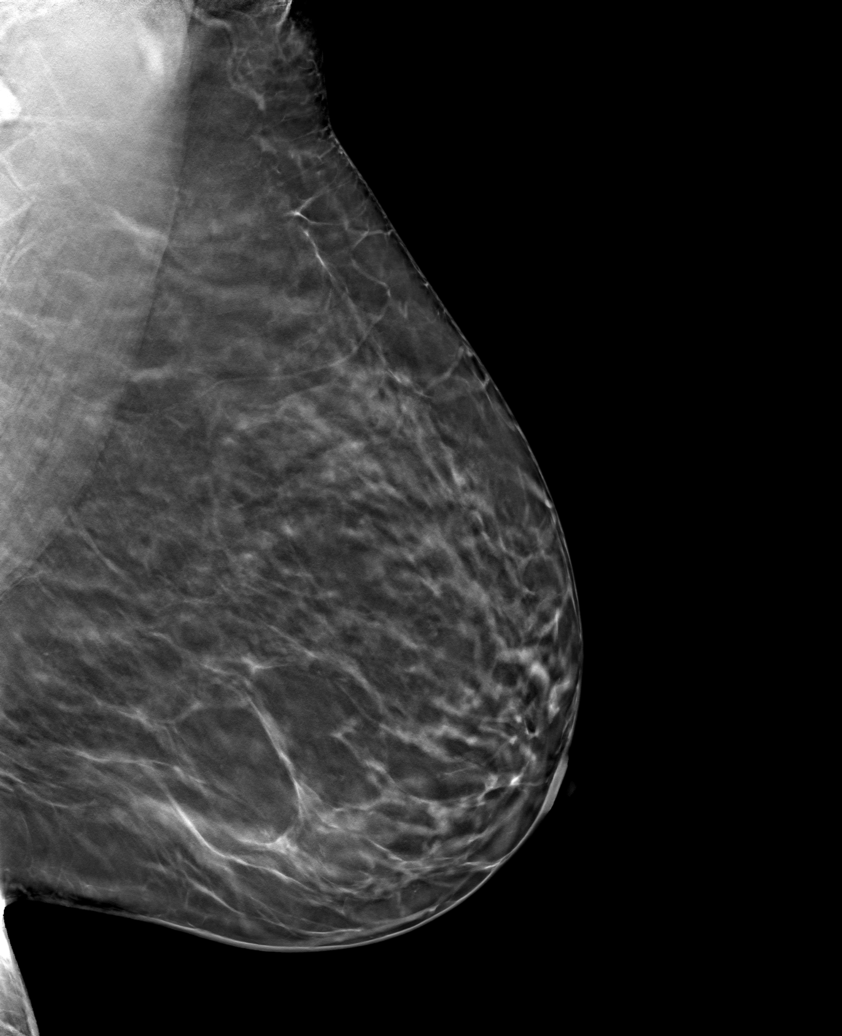

[6 of 30 positions shown; findings below may reference images not displayed]

ACR Breast Density Category b: There are scattered areas of
fibroglandular density.
FINDINGS: A BB has been placed on the right axilla indicating the palpable
site of concern. There are no suspicious mammographic findings deep
to the marker. No suspicious calcifications, masses or areas of
distortion are seen in the bilateral breasts.

Mammographic images were processed with CAD.

Physical exam of the palpable site in the right axilla demonstrates
no discrete palpable masses. There is mild asymmetric fullness to
the axillary fat pad on the right as compared to the left.

Ultrasound targeted to the right axilla demonstrates normal
subcutaneous tissue. No masses or suspicious areas of shadowing are
identified.
IMPRESSION: 1. There are no suspicious mammographic or targeted sonographic
abnormalities seen at the palpable site of concern in the right
axilla.

2.  No mammographic evidence of malignancy in the bilateral breasts.

RECOMMENDATION:
1. Clinical follow-up recommended for the palpable area of concern
in the right axilla. Any further workup should be based on clinical
grounds.

2.  Screening mammogram in one year.(Code:[id number])

I have discussed the findings and recommendations with the patient.
If applicable, a reminder letter will be sent to the patient
regarding the next appointment.

BI-RADS CATEGORY  1: Negative.

## 2021-11-15 ENCOUNTER — Other Ambulatory Visit (HOSPITAL_COMMUNITY): Payer: Self-pay

## 2021-11-15 MED ORDER — WEGOVY 0.25 MG/0.5ML ~~LOC~~ SOAJ
SUBCUTANEOUS | 0 refills | Status: AC
  Filled 2021-11-15: qty 2, 28d supply, fill #0

## 2021-11-26 ENCOUNTER — Other Ambulatory Visit (HOSPITAL_COMMUNITY): Payer: Self-pay

## 2021-12-08 ENCOUNTER — Other Ambulatory Visit (HOSPITAL_COMMUNITY): Payer: Self-pay

## 2021-12-08 MED ORDER — ONDANSETRON HCL 4 MG PO TABS
ORAL_TABLET | ORAL | 0 refills | Status: AC
  Filled 2021-12-08: qty 20, 30d supply, fill #0

## 2021-12-08 MED ORDER — WEGOVY 0.5 MG/0.5ML ~~LOC~~ SOAJ
SUBCUTANEOUS | 0 refills | Status: AC
  Filled 2021-12-08: qty 2, 28d supply, fill #0

## 2021-12-10 ENCOUNTER — Other Ambulatory Visit (HOSPITAL_COMMUNITY): Payer: Self-pay

## 2021-12-17 ENCOUNTER — Other Ambulatory Visit (HOSPITAL_COMMUNITY): Payer: Self-pay

## 2021-12-17 ENCOUNTER — Other Ambulatory Visit: Payer: Self-pay | Admitting: Internal Medicine

## 2021-12-17 DIAGNOSIS — Z1231 Encounter for screening mammogram for malignant neoplasm of breast: Secondary | ICD-10-CM

## 2022-01-07 ENCOUNTER — Ambulatory Visit: Payer: 59

## 2022-01-12 ENCOUNTER — Other Ambulatory Visit (HOSPITAL_COMMUNITY): Payer: Self-pay

## 2022-01-12 MED ORDER — WEGOVY 1 MG/0.5ML ~~LOC~~ SOAJ
1.0000 mg | SUBCUTANEOUS | 0 refills | Status: AC
  Filled 2022-01-12: qty 2, 28d supply, fill #0

## 2022-01-17 ENCOUNTER — Other Ambulatory Visit (HOSPITAL_COMMUNITY): Payer: Self-pay

## 2022-01-18 ENCOUNTER — Other Ambulatory Visit (HOSPITAL_COMMUNITY): Payer: Self-pay

## 2022-01-19 ENCOUNTER — Other Ambulatory Visit (HOSPITAL_COMMUNITY): Payer: Self-pay

## 2022-02-08 ENCOUNTER — Ambulatory Visit: Payer: 59

## 2022-04-14 ENCOUNTER — Ambulatory Visit
Admission: RE | Admit: 2022-04-14 | Discharge: 2022-04-14 | Disposition: A | Discharge status: Home / Self Care | Payer: 59 | Source: Ambulatory Visit | Attending: Internal Medicine | Admitting: Internal Medicine

## 2022-04-14 DIAGNOSIS — Z1231 Encounter for screening mammogram for malignant neoplasm of breast: Secondary | ICD-10-CM

## 2023-02-08 ENCOUNTER — Other Ambulatory Visit (HOSPITAL_COMMUNITY): Payer: Self-pay

## 2023-02-08 MED ORDER — INFLUENZA VIRUS VACC SPLIT PF (FLUZONE) 0.5 ML IM SUSY
0.5000 mL | PREFILLED_SYRINGE | Freq: Once | INTRAMUSCULAR | 0 refills | Status: AC
  Filled 2023-02-08: qty 0.5, 1d supply, fill #0

## 2023-04-04 ENCOUNTER — Other Ambulatory Visit: Payer: Self-pay | Admitting: Obstetrics and Gynecology

## 2023-04-04 DIAGNOSIS — Z1231 Encounter for screening mammogram for malignant neoplasm of breast: Secondary | ICD-10-CM

## 2023-05-03 ENCOUNTER — Ambulatory Visit
Admission: RE | Admit: 2023-05-03 | Discharge: 2023-05-03 | Disposition: A | Discharge status: Home / Self Care | Payer: 59 | Source: Ambulatory Visit | Attending: Obstetrics and Gynecology | Admitting: Obstetrics and Gynecology

## 2023-05-03 DIAGNOSIS — Z1231 Encounter for screening mammogram for malignant neoplasm of breast: Secondary | ICD-10-CM

## 2023-10-12 ENCOUNTER — Other Ambulatory Visit: Payer: Self-pay | Admitting: Internal Medicine

## 2023-10-12 ENCOUNTER — Ambulatory Visit
Admission: RE | Admit: 2023-10-12 | Discharge: 2023-10-12 | Disposition: A | Discharge status: Home / Self Care | Source: Ambulatory Visit | Attending: Internal Medicine | Admitting: Internal Medicine

## 2023-10-12 DIAGNOSIS — M2550 Pain in unspecified joint: Secondary | ICD-10-CM

## 2024-01-15 ENCOUNTER — Other Ambulatory Visit (HOSPITAL_COMMUNITY): Payer: Self-pay

## 2024-01-15 MED ORDER — FLUZONE 0.5 ML IM SUSY
0.5000 mL | PREFILLED_SYRINGE | Freq: Once | INTRAMUSCULAR | 0 refills | Status: AC
  Filled 2024-01-15: qty 0.5, 1d supply, fill #0

## 2024-02-14 ENCOUNTER — Other Ambulatory Visit (HOSPITAL_COMMUNITY): Payer: Self-pay
